# Patient Record
Sex: Female | Born: 1935 | Race: Black or African American | Hispanic: No | Smoking: Never smoker
Health system: Southern US, Community
[De-identification: ages and names within clinical notes are randomized; demographics above are authoritative.]

## PROBLEM LIST (undated history)

## (undated) DIAGNOSIS — I2510 Atherosclerotic heart disease of native coronary artery without angina pectoris: Secondary | ICD-10-CM

## (undated) DIAGNOSIS — J45909 Unspecified asthma, uncomplicated: Secondary | ICD-10-CM

## (undated) DIAGNOSIS — I739 Peripheral vascular disease, unspecified: Secondary | ICD-10-CM

## (undated) DIAGNOSIS — Z940 Kidney transplant status: Secondary | ICD-10-CM

## (undated) DIAGNOSIS — M109 Gout, unspecified: Secondary | ICD-10-CM

## (undated) DIAGNOSIS — I10 Essential (primary) hypertension: Secondary | ICD-10-CM

## (undated) DIAGNOSIS — E785 Hyperlipidemia, unspecified: Secondary | ICD-10-CM

## (undated) DIAGNOSIS — E119 Type 2 diabetes mellitus without complications: Secondary | ICD-10-CM

## (undated) HISTORY — PX: AV FISTULA PLACEMENT: SHX1204

---

## 1975-05-14 HISTORY — PX: ABDOMINAL HYSTERECTOMY: SHX81

## 2003-05-14 HISTORY — PX: KIDNEY TRANSPLANT: SHX239

## 2010-05-25 ENCOUNTER — Other Ambulatory Visit: Payer: Self-pay | Admitting: Nephrology

## 2010-06-13 ENCOUNTER — Other Ambulatory Visit: Payer: Self-pay | Admitting: Nephrology

## 2010-07-12 ENCOUNTER — Other Ambulatory Visit: Payer: Self-pay | Admitting: Nephrology

## 2010-08-22 ENCOUNTER — Other Ambulatory Visit: Payer: Self-pay

## 2010-09-11 ENCOUNTER — Other Ambulatory Visit: Payer: Self-pay

## 2010-10-12 ENCOUNTER — Other Ambulatory Visit: Payer: Self-pay

## 2010-11-30 ENCOUNTER — Other Ambulatory Visit: Payer: Self-pay

## 2010-12-12 ENCOUNTER — Other Ambulatory Visit: Payer: Self-pay

## 2011-01-12 ENCOUNTER — Other Ambulatory Visit: Payer: Self-pay

## 2011-03-07 ENCOUNTER — Other Ambulatory Visit: Payer: Self-pay

## 2011-03-14 ENCOUNTER — Other Ambulatory Visit: Payer: Self-pay

## 2011-04-13 ENCOUNTER — Other Ambulatory Visit: Payer: Self-pay

## 2011-07-08 ENCOUNTER — Other Ambulatory Visit: Payer: Self-pay

## 2011-07-08 LAB — BASIC METABOLIC PANEL
Calcium, Total: 9.7 mg/dL (ref 8.5–10.1)
EGFR (African American): 44 — ABNORMAL LOW
EGFR (Non-African Amer.): 36 — ABNORMAL LOW
Glucose: 145 mg/dL — ABNORMAL HIGH (ref 65–99)
Osmolality: 294 (ref 275–301)

## 2011-07-08 LAB — CBC WITH DIFFERENTIAL/PLATELET
Basophil #: 0 10*3/uL (ref 0.0–0.1)
Basophil %: 0.3 %
HCT: 40 % (ref 35.0–47.0)
HGB: 12.9 g/dL (ref 12.0–16.0)
Lymphocyte %: 15.5 %
Monocyte %: 5 %
Neutrophil #: 5.5 10*3/uL (ref 1.4–6.5)
WBC: 7.1 10*3/uL (ref 3.6–11.0)

## 2011-07-12 ENCOUNTER — Other Ambulatory Visit: Payer: Self-pay

## 2011-07-24 LAB — BASIC METABOLIC PANEL
Anion Gap: 8 (ref 7–16)
BUN: 37 mg/dL — ABNORMAL HIGH (ref 7–18)
Chloride: 106 mmol/L (ref 98–107)
Co2: 25 mmol/L (ref 21–32)
Creatinine: 1.73 mg/dL — ABNORMAL HIGH (ref 0.60–1.30)
EGFR (Non-African Amer.): 31 — ABNORMAL LOW
Osmolality: 287 (ref 275–301)
Sodium: 139 mmol/L (ref 136–145)

## 2011-07-24 LAB — PHOSPHORUS: Phosphorus: 3.6 mg/dL (ref 2.5–4.9)

## 2011-07-24 LAB — CBC WITH DIFFERENTIAL/PLATELET
Basophil #: 0 10*3/uL (ref 0.0–0.1)
Eosinophil #: 0.3 10*3/uL (ref 0.0–0.7)
HCT: 38.9 % (ref 35.0–47.0)
Lymphocyte %: 24.2 %
MCH: 26.5 pg (ref 26.0–34.0)
MCHC: 30.8 g/dL — ABNORMAL LOW (ref 32.0–36.0)
MCV: 86 fL (ref 80–100)
Monocyte %: 7.5 %
Neutrophil #: 4.7 10*3/uL (ref 1.4–6.5)
Neutrophil %: 64.4 %
RDW: 13.2 % (ref 11.5–14.5)

## 2011-07-24 LAB — MAGNESIUM: Magnesium: 2 mg/dL

## 2011-08-12 ENCOUNTER — Other Ambulatory Visit: Payer: Self-pay

## 2011-09-06 LAB — HEMOGLOBIN A1C: Hemoglobin A1C: 8.1 % — ABNORMAL HIGH (ref 4.2–6.3)

## 2011-09-11 ENCOUNTER — Other Ambulatory Visit: Payer: Self-pay

## 2011-09-13 LAB — BASIC METABOLIC PANEL
Calcium, Total: 11.1 mg/dL — ABNORMAL HIGH (ref 8.5–10.1)
Creatinine: 1.78 mg/dL — ABNORMAL HIGH (ref 0.60–1.30)

## 2011-09-13 LAB — MAGNESIUM: Magnesium: 1.9 mg/dL

## 2011-11-01 ENCOUNTER — Other Ambulatory Visit: Payer: Self-pay

## 2011-11-01 LAB — BASIC METABOLIC PANEL WITH GFR
Anion Gap: 7
BUN: 46 mg/dL — ABNORMAL HIGH
Calcium, Total: 9.4 mg/dL
Chloride: 105 mmol/L
Co2: 26 mmol/L
Creatinine: 2.08 mg/dL — ABNORMAL HIGH
EGFR (African American): 27 — ABNORMAL LOW
EGFR (Non-African Amer.): 23 — ABNORMAL LOW
Glucose: 123 mg/dL — ABNORMAL HIGH
Osmolality: 289
Potassium: 4.4 mmol/L
Sodium: 138 mmol/L

## 2011-11-01 LAB — CBC WITH DIFFERENTIAL/PLATELET
Basophil #: 0 x10 3/mm 3
Basophil %: 0.6 %
Eosinophil #: 0.2 x10 3/mm 3
Eosinophil %: 2.6 %
HCT: 38 %
HGB: 12.1 g/dL
Lymphocyte %: 29.3 %
Lymphs Abs: 2.3 x10 3/mm 3
MCH: 27.5 pg
MCHC: 31.8 g/dL — ABNORMAL LOW
MCV: 87 fL
Monocyte #: 0.8 "x10 3/mm "
Monocyte %: 10 %
Neutrophil #: 4.4 x10 3/mm 3
Neutrophil %: 57.5 %
Platelet: 226 x10 3/mm 3
RBC: 4.39 X10 6/mm 3
RDW: 14.6 % — ABNORMAL HIGH
WBC: 7.7 x10 3/mm 3

## 2011-11-01 LAB — PHOSPHORUS: Phosphorus: 3.9 mg/dL (ref 2.5–4.9)

## 2011-11-11 ENCOUNTER — Other Ambulatory Visit: Payer: Self-pay

## 2011-11-13 ENCOUNTER — Encounter (HOSPITAL_COMMUNITY): Payer: Self-pay | Admitting: Radiology

## 2011-11-13 ENCOUNTER — Emergency Department (HOSPITAL_COMMUNITY): Payer: Medicare Other

## 2011-11-13 ENCOUNTER — Inpatient Hospital Stay (HOSPITAL_COMMUNITY)
Admission: EM | Admit: 2011-11-13 | Discharge: 2011-11-15 | DRG: 312 | Disposition: A | Discharge status: Home / Self Care | Payer: Medicare Other | Attending: Internal Medicine | Admitting: Internal Medicine

## 2011-11-13 DIAGNOSIS — Z940 Kidney transplant status: Secondary | ICD-10-CM

## 2011-11-13 DIAGNOSIS — E785 Hyperlipidemia, unspecified: Secondary | ICD-10-CM | POA: Diagnosis present

## 2011-11-13 DIAGNOSIS — I739 Peripheral vascular disease, unspecified: Secondary | ICD-10-CM

## 2011-11-13 DIAGNOSIS — E119 Type 2 diabetes mellitus without complications: Secondary | ICD-10-CM

## 2011-11-13 DIAGNOSIS — R55 Syncope and collapse: Principal | ICD-10-CM | POA: Diagnosis present

## 2011-11-13 DIAGNOSIS — J45909 Unspecified asthma, uncomplicated: Secondary | ICD-10-CM | POA: Diagnosis present

## 2011-11-13 DIAGNOSIS — I2510 Atherosclerotic heart disease of native coronary artery without angina pectoris: Secondary | ICD-10-CM | POA: Diagnosis present

## 2011-11-13 DIAGNOSIS — M109 Gout, unspecified: Secondary | ICD-10-CM

## 2011-11-13 DIAGNOSIS — R29810 Facial weakness: Secondary | ICD-10-CM | POA: Clinically undetermined

## 2011-11-13 DIAGNOSIS — D649 Anemia, unspecified: Secondary | ICD-10-CM | POA: Diagnosis present

## 2011-11-13 DIAGNOSIS — I10 Essential (primary) hypertension: Secondary | ICD-10-CM | POA: Diagnosis present

## 2011-11-13 HISTORY — DX: Peripheral vascular disease, unspecified: I73.9

## 2011-11-13 HISTORY — DX: Kidney transplant status: Z94.0

## 2011-11-13 HISTORY — DX: Unspecified asthma, uncomplicated: J45.909

## 2011-11-13 HISTORY — DX: Type 2 diabetes mellitus without complications: E11.9

## 2011-11-13 HISTORY — DX: Atherosclerotic heart disease of native coronary artery without angina pectoris: I25.10

## 2011-11-13 HISTORY — DX: Hyperlipidemia, unspecified: E78.5

## 2011-11-13 HISTORY — DX: Gout, unspecified: M10.9

## 2011-11-13 HISTORY — DX: Essential (primary) hypertension: I10

## 2011-11-13 LAB — BASIC METABOLIC PANEL
BUN: 32 mg/dL — ABNORMAL HIGH (ref 6–23)
Calcium: 9.5 mg/dL (ref 8.4–10.5)
GFR calc Af Amer: 39 mL/min — ABNORMAL LOW (ref >90)
GFR calc non Af Amer: 34 mL/min — ABNORMAL LOW (ref >90)
Glucose, Bld: 132 mg/dL — ABNORMAL HIGH (ref 70–99)
Potassium: 3.8 mEq/L (ref 3.5–5.1)

## 2011-11-13 LAB — CBC WITH DIFFERENTIAL/PLATELET
Basophils Relative: 0 % (ref 0–1)
Eosinophils Absolute: 0.2 10*3/uL (ref 0.0–0.7)
Eosinophils Relative: 3 % (ref 0–5)
Hemoglobin: 11.7 g/dL — ABNORMAL LOW (ref 12.0–15.0)
Lymphs Abs: 1.5 10*3/uL (ref 0.7–4.0)
MCH: 26.9 pg (ref 26.0–34.0)
MCHC: 31.2 g/dL (ref 30.0–36.0)
MCV: 86.2 fL (ref 78.0–100.0)
Monocytes Relative: 9 % (ref 3–12)
Neutrophils Relative %: 67 % (ref 43–77)
Platelets: 177 10*3/uL (ref 150–400)
RBC: 4.35 MIL/uL (ref 3.87–5.11)

## 2011-11-13 LAB — HEPATIC FUNCTION PANEL
ALT: 9 U/L (ref 0–35)
AST: 12 U/L (ref 0–37)
Albumin: 3.6 g/dL (ref 3.5–5.2)
Bilirubin, Direct: <0.1 mg/dL (ref 0.0–0.3)
Total Bilirubin: 0.3 mg/dL (ref 0.3–1.2)

## 2011-11-13 LAB — CARDIAC PANEL(CRET KIN+CKTOT+MB+TROPI)
Relative Index: INVALID (ref 0.0–2.5)
Total CK: 55 U/L (ref 7–177)
Troponin I: <0.3 ng/mL (ref <0.30)

## 2011-11-13 MED ORDER — ASPIRIN EC 325 MG PO TBEC
325.0000 mg | DELAYED_RELEASE_TABLET | Freq: Every day | ORAL | Status: DC
  Administered 2011-11-14 – 2011-11-15 (×2): 325 mg via ORAL
  Filled 2011-11-13 (×3): qty 1

## 2011-11-13 MED ORDER — CLONIDINE HCL 0.1 MG PO TABS
0.1000 mg | ORAL_TABLET | Freq: Three times a day (TID) | ORAL | Status: DC
  Administered 2011-11-13 – 2011-11-15 (×6): 0.1 mg via ORAL
  Filled 2011-11-13 (×7): qty 1

## 2011-11-13 MED ORDER — IPRATROPIUM BROMIDE 0.02 % IN SOLN: 0.5000 mg | RESPIRATORY_TRACT | Status: DC | PRN

## 2011-11-13 MED ORDER — ALBUTEROL SULFATE (5 MG/ML) 0.5% IN NEBU: 2.5000 mg | INHALATION_SOLUTION | RESPIRATORY_TRACT | Status: DC | PRN

## 2011-11-13 MED ORDER — PRAVASTATIN SODIUM 40 MG PO TABS
40.0000 mg | ORAL_TABLET | Freq: Every day | ORAL | Status: DC
  Administered 2011-11-13 – 2011-11-15 (×3): 40 mg via ORAL
  Filled 2011-11-13 (×3): qty 1

## 2011-11-13 MED ORDER — CALCIUM CARBONATE-VITAMIN D 500-50 MG-UNIT PO CAPS: 1.0000 | ORAL_CAPSULE | Freq: Every day | ORAL | Status: DC

## 2011-11-13 MED ORDER — SODIUM CHLORIDE 0.9 % IV SOLN
INTRAVENOUS | Status: DC
  Administered 2011-11-13 – 2011-11-14 (×2): via INTRAVENOUS

## 2011-11-13 MED ORDER — ACETAMINOPHEN 325 MG PO TABS: 650.0000 mg | ORAL_TABLET | Freq: Four times a day (QID) | ORAL | Status: DC | PRN

## 2011-11-13 MED ORDER — INSULIN ASPART 100 UNIT/ML ~~LOC~~ SOLN
0.0000 [IU] | Freq: Three times a day (TID) | SUBCUTANEOUS | Status: DC
  Administered 2011-11-14 – 2011-11-15 (×3): 1 [IU] via SUBCUTANEOUS

## 2011-11-13 MED ORDER — SPIRONOLACTONE 25 MG PO TABS
25.0000 mg | ORAL_TABLET | Freq: Every day | ORAL | Status: DC
  Administered 2011-11-13 – 2011-11-15 (×3): 25 mg via ORAL
  Filled 2011-11-13 (×3): qty 1

## 2011-11-13 MED ORDER — ENALAPRIL MALEATE 20 MG PO TABS
20.0000 mg | ORAL_TABLET | Freq: Two times a day (BID) | ORAL | Status: DC
  Administered 2011-11-13 – 2011-11-15 (×4): 20 mg via ORAL
  Filled 2011-11-13 (×5): qty 1

## 2011-11-13 MED ORDER — METOPROLOL TARTRATE 100 MG PO TABS
100.0000 mg | ORAL_TABLET | Freq: Two times a day (BID) | ORAL | Status: DC
  Administered 2011-11-13 – 2011-11-15 (×4): 100 mg via ORAL
  Filled 2011-11-13 (×5): qty 1

## 2011-11-13 MED ORDER — SODIUM CHLORIDE 0.9 % IJ SOLN
3.0000 mL | Freq: Two times a day (BID) | INTRAMUSCULAR | Status: DC
  Administered 2011-11-14 – 2011-11-15 (×3): 3 mL via INTRAVENOUS

## 2011-11-13 MED ORDER — CALCIUM CARBONATE-VITAMIN D 500-200 MG-UNIT PO TABS
1.0000 | ORAL_TABLET | Freq: Every day | ORAL | Status: DC
  Administered 2011-11-13 – 2011-11-15 (×3): 1 via ORAL
  Filled 2011-11-13 (×3): qty 1

## 2011-11-13 MED ORDER — CHLORTHALIDONE 25 MG PO TABS
25.0000 mg | ORAL_TABLET | Freq: Every day | ORAL | Status: DC
Start: 2011-11-13 — End: 2011-11-15
  Administered 2011-11-13 – 2011-11-15 (×3): 25 mg via ORAL
  Filled 2011-11-13 (×3): qty 1

## 2011-11-13 MED ORDER — MYCOPHENOLATE SODIUM 180 MG PO TBEC
360.0000 mg | DELAYED_RELEASE_TABLET | Freq: Two times a day (BID) | ORAL | Status: DC
  Administered 2011-11-13 – 2011-11-15 (×4): 360 mg via ORAL
  Filled 2011-11-13 (×5): qty 2

## 2011-11-13 MED ORDER — FELODIPINE ER 10 MG PO TB24
10.0000 mg | ORAL_TABLET | Freq: Two times a day (BID) | ORAL | Status: DC
  Administered 2011-11-13 – 2011-11-15 (×4): 10 mg via ORAL
  Filled 2011-11-13 (×5): qty 1

## 2011-11-13 MED ORDER — PREDNISONE 5 MG PO TABS
5.0000 mg | ORAL_TABLET | Freq: Every day | ORAL | Status: DC
  Administered 2011-11-13 – 2011-11-15 (×3): 5 mg via ORAL
  Filled 2011-11-13 (×3): qty 1

## 2011-11-13 MED ORDER — ONDANSETRON HCL 4 MG PO TABS: 4.0000 mg | ORAL_TABLET | Freq: Four times a day (QID) | ORAL | Status: DC | PRN

## 2011-11-13 MED ORDER — PANTOPRAZOLE SODIUM 40 MG PO TBEC
40.0000 mg | DELAYED_RELEASE_TABLET | Freq: Every day | ORAL | Status: DC
  Administered 2011-11-13 – 2011-11-15 (×3): 40 mg via ORAL
  Filled 2011-11-13 (×3): qty 1

## 2011-11-13 MED ORDER — ONDANSETRON HCL 4 MG/2ML IJ SOLN: 4.0000 mg | Freq: Four times a day (QID) | INTRAMUSCULAR | Status: DC | PRN

## 2011-11-13 MED ORDER — TACROLIMUS 1 MG PO CAPS
2.0000 mg | ORAL_CAPSULE | Freq: Two times a day (BID) | ORAL | Status: DC
  Administered 2011-11-13 – 2011-11-15 (×4): 2 mg via ORAL
  Filled 2011-11-13 (×5): qty 2

## 2011-11-13 MED ORDER — ACETAMINOPHEN 650 MG RE SUPP: 650.0000 mg | Freq: Four times a day (QID) | RECTAL | Status: DC | PRN

## 2011-11-13 MED ORDER — SIMVASTATIN 20 MG PO TABS: 20.0000 mg | ORAL_TABLET | Freq: Every day | ORAL | Status: DC

## 2011-11-13 NOTE — H&P (Signed)
Internal Medicine Teaching Service Resident Admission Note Date: 11/13/2011  Patient name: Wendy Key Medical record number: [id number] Date of birth: 11-19-1935 Age: 76 y.o. Gender: female PCP: Pcp Not In System  Medical Service: Internal Medicine Teaching Service, [facility] Service  I have reviewed the note by Alanson Puls MS IV and was present during the interview and physical exam.  Please see below for findings, assessment, and plan.  Chief Complaint: Syncope  History of Present Illness: Wendy Key is a 76 year old woman with a PMH significant for hypertension, hyperlipidemia, history of ESRD now s/p renal transplant 8 years ago who presented to the [facility] ED following a syncopal episode in church today.  She was at a funeral for her nephew and states she started to feel sick to her stomach.  Her daughter was sitting next to her and noticed that she looked ill and asked her what was wrong when she started to shift to her right and her eyes rolled back and she passed out.  Her daughter states that she did not observe any rhythmic movement of her body or tongue biting but the patient did lose control of her bladder.  Her daughter states she was out for "between 2-5 minutes."  When she came too there was no confusion but she did not remember the event happening or any other preceding symptoms other then feeling hot and the nausea.  She denies palpitations, SOB, numbness, tingling, or weakness.  [name] states that she feels back to her normal currently without treatment in the ED.    Meds: Medications Prior to Admission  Medication Sig Dispense Refill  . acetaminophen (TYLENOL) 500 MG tablet Take 1,000 mg by mouth 2 (two) times daily as needed. For pain      . albuterol (PROVENTIL HFA;VENTOLIN HFA) 108 (90 BASE) MCG/ACT inhaler Inhale 1 puff into the lungs every 6 (six) hours as needed. For shortness of breath      . aspirin EC 325 MG tablet Take 325 mg by mouth daily.      . Calcium  Carbonate-Vitamin D (CALCIUM PLUS VITAMIN D PO) Take 1 tablet by mouth daily.      . chlorthalidone (HYGROTON) 25 MG tablet Take 25 mg by mouth daily.      . cloNIDine (CATAPRES) 0.1 MG tablet Take 0.1 mg by mouth 3 (three) times daily.      . enalapril (VASOTEC) 20 MG tablet Take 20 mg by mouth 2 (two) times daily.      . felodipine (PLENDIL) 10 MG 24 hr tablet Take 10 mg by mouth 2 (two) times daily.      . Fluticasone-Salmeterol (ADVAIR) 250-50 MCG/DOSE AEPB Inhale 1 puff into the lungs every 12 (twelve) hours.      . metoprolol (LOPRESSOR) 100 MG tablet Take 100 mg by mouth 2 (two) times daily.      . mycophenolate (MYFORTIC) 360 MG TBEC Take 360 mg by mouth 2 (two) times daily.      Marland Kitchen omeprazole (PRILOSEC) 20 MG capsule Take 20 mg by mouth daily.      . pravastatin (PRAVACHOL) 40 MG tablet Take 40 mg by mouth daily.      . predniSONE (DELTASONE) 5 MG tablet Take 5 mg by mouth daily.      Marland Kitchen spironolactone (ALDACTONE) 25 MG tablet Take 25 mg by mouth daily.      . tacrolimus (PROGRAF) 1 MG capsule Take 2 mg by mouth 2 (two) times daily.      Marland Kitchen  tiotropium (SPIRIVA) 18 MCG inhalation capsule Place 18 mcg into inhaler and inhale daily.       Allergies: Allergies as of 11/13/2011  . (No Known Allergies)    Past Medical History: Medical Student note reviewed  Family History: Medical Student note reviewed  Social History: Medical Student note reviewed  Surgical History: Medical Student note reviewed  Review of System: Medical Student note reviewed  Physical Exam: Blood pressure 188/82, pulse 60, temperature 98.3 F (36.8 C), temperature source Oral, resp. rate 16, height 5\' 6"  (1.676 m), weight 168 lb 6.9 oz (76.4 kg), SpO2 100.00%. Constitutional: Vital signs reviewed.  Patient is a well-developed and well-nourished woman in no acute distress and cooperative with exam. Alert and oriented x3.  Orthostatic vitals reviewed and are WNL. Head: Normocephalic and atraumatic Ear: TM  normal bilaterally Mouth: no erythema or exudates, MMM Eyes: PERRL, EOMI, conjunctivae normal, No scleral icterus.  Neck: Supple, Trachea midline normal ROM, No JVD, mass, thyromegaly, or carotid bruit present.  Cardiovascular: RRR, S1 normal, S2 normal, no MRG, pulses symmetric and intact bilaterally Pulmonary/Chest: CTAB, no wheezes, rales, or rhonchi Abdominal: Soft. Non-tender, non-distended, bowel sounds are normal, no masses, organomegaly, or guarding present.  GU: no CVA tenderness Musculoskeletal: No joint deformities, erythema, or stiffness, ROM full and no nontender Hematology: no cervical, inginal, or axillary adenopathy.  Neurological: A&O x3, Strength is normal and symmetric bilaterally, cranial nerve II-XII are grossly intact, no focal motor deficit, sensory intact to light touch bilaterally.  Skin: Warm, dry and intact. No rash, cyanosis, or clubbing.  Psychiatric: Normal mood and affect. speech and behavior is normal. Judgment and thought content normal. Cognition and memory are normal.   Labs: Reviewed as noted in the Electronic Record  Imaging: Reviewed as noted in the Electronic Record  Assessment & Plan by Problem: Wendy Key is a 76 year old woman with the PMH listed above who presents following a syncopal episode in church today.   1. Syncopal episode: She states that she has never had this before and that the church was not hot at the time.  Differential Dx includes cardiac, neurology, orthostatic or vasovagal causes of syncope.  There was no seizure-like activity and there was no post-ictal state to indicate seizure.  She has no focal neuro deficits and an unremarkable head CT makes a neuro etiology unlikely.  Orthostatic BP's were normal.  Her initial HR was 55 on arrival and her EKG was only significant for sinus bradycardia secondary to medication effect.  Cardiac etiology including arrythmia or possible symptomatic bradycardia is possible.  Vasovagal response is  also very likely   -Admit to telemetry  -cardiac enzyme trending  -BMET/TSH in AM  -2D Echo in the AM  2.  Hypertension:  She had taken some of her medications in the morning including her blood pressure medications.  Her BP in the ED was164/81-141/68, and we will restart her home medications and continue to monitor BPs.  -clonidine 0.1mg  tid  -enalapril 20 mg bid  -spironolactone 25 mg daily  -chlorthalidone 25 mg daily  - Felodipine 10 mg BID  - Metoprolol 100 mg BID  3.  Elevated creatinine: Serum creatinine was elevated at 1.49.  Pt is 8 years s/p renal transplant, and a review of her records from [facility] reveals that she is at her baseline.  Her baseline Cr ~1.5-1.8.  We will start the patient on IV normal saline to ensure that she is not dehydrated, as well as check urine electrolytes to  calculate a FENa.    - follow-up Cr on AM BMET.  4.  Hyperlipidemia: Will continue patient on home meds and check a FLP in the AM.  -pravastatin 40 mg daily  5.  Immunosuppression: She will need to be restart her immunosuppression for s/p renal transplant.  -mycophenolate 360 mg bid  -prednisone 5 mg daily  -tacrolimus 2 mg po bid  6. CAD: Patient's EKG reveals that her rate is in the 50-60s; we discussed that this might have contributed to her episode.  This appears to be her baseline rate from a review of her medical records.  She has had no changes in her medications. We will continue patient on home meds.  Signed: Hakop Humbarger 11/13/2011, 7:51 PM     Medical Student Hospital Admission Note Date: 11/13/2011  Patient name: Mithra Spano Medical record number: [id number] Date of birth: [birth year] Age: 76 y.o. Gender: female PCP: Pcp Not In System  Medical Service: Internal Medicine B1  Attending physician: Dr. Josem Kaufmann      Chief Complaint: Loss of Consciousness   History of Present Illness: Ms. Scheerer is 76 year old female with an extensive past medical history significant for  hypertension, hyperlipidemia, diabetes mellitus, status-post kidney transplant (2008).  The patient reports that she was attending a church funeral and suddenly began to feel "nauseous and sick to her stomach." At the time she was seated, and had not attempted to stand. She was attending the funeral with her daughter who noticed that she was not feeling well at this time.  After beginning to feel sick, she has no other recollections of the event; all other history is provided by her daughter.  Her daughter states that she started to fall over and she attempted to hold the patient up. The patient then went limp and her dentures started to come out; the daughter believed that she might have been having a seizure or stroke because the pt's eyes were rolling around in her head and she lost consciousness.  She estimates that she was out for ~6 minutes.  She lost control of her bladder, but did not have an vomiting, sweats, dizziness, or aura prior to the event.  Reportedly, there was also no tongue biting or shaking.  Upon regaining consciousness, she was confused as to what happened.  Meds: No current facility-administered medications on file prior to encounter.   Current Outpatient Prescriptions on File Prior to Encounter  Medication Sig Dispense Refill  . albuterol (PROVENTIL HFA;VENTOLIN HFA) 108 (90 BASE) MCG/ACT inhaler Inhale 1 puff into the lungs every 6 (six) hours as needed. For shortness of breath      . Calcium Carbonate-Vitamin D (CALCIUM PLUS VITAMIN D PO) Take 1 tablet by mouth daily.      . chlorthalidone (HYGROTON) 25 MG tablet Take 25 mg by mouth daily.      . cloNIDine (CATAPRES) 0.1 MG tablet Take 0.1 mg by mouth 3 (three) times daily.      . enalapril (VASOTEC) 20 MG tablet Take 20 mg by mouth 2 (two) times daily.      . felodipine (PLENDIL) 10 MG 24 hr tablet Take 10 mg by mouth 2 (two) times daily.      . Fluticasone-Salmeterol (ADVAIR) 250-50 MCG/DOSE AEPB Inhale 1 puff into the lungs  every 12 (twelve) hours.      . metoprolol (LOPRESSOR) 100 MG tablet Take 100 mg by mouth 2 (two) times daily.      Marland Kitchen omeprazole (PRILOSEC) 20 MG capsule  Take 20 mg by mouth daily.      . pravastatin (PRAVACHOL) 40 MG tablet Take 40 mg by mouth daily.      Marland Kitchen spironolactone (ALDACTONE) 25 MG tablet Take 25 mg by mouth daily.      Marland Kitchen tiotropium (SPIRIVA) 18 MCG inhalation capsule Place 18 mcg into inhaler and inhale daily.       Allergies: Allergies as of 11/13/2011  . (No Known Allergies)   Past Medical History  Diagnosis Date  . S/P kidney transplant 11/13/2011    cadevaric transplant  2005 at [facility] Followed by Dr. Cecil Cranker  . Hypertension 11/13/2011  . Hyperlipidemia 11/13/2011  . Diabetes mellitus type II 11/13/2011  . CAD (coronary artery disease) 11/13/2011    50% LAD lesion diagnosed at [facility]  . Asthma 11/13/2011  . Peripheral vascular disease 11/13/2011  . Gout 11/13/2011   Past Surgical History  Procedure Date  . Kidney transplant 2005    At [facility]  . Av fistula placement     x2 with removal of left arm fistula  . Abdominal hysterectomy 1977    for fibroids   Family History  Problem Relation Age of Onset  . Diabetes Father   . Diabetes type II Son    History   Social History  . Marital Status: Widowed    Spouse Name: N/A    Number of Children: N/A  . Years of Education: N/A   Occupational History  . Not on file.   Social History Main Topics  . Smoking status: Never Smoker   . Smokeless tobacco: Never Used  . Alcohol Use: No  . Drug Use: No  . Sexually Active: Not Currently   Other Topics Concern  . Not on file   Social History Narrative   Patient lives in [place] with her Daughter.  PCP is Dr. Ardis Rowan in [place].    Review of Systems: Constitutional: negative Respiratory: negative Cardiovascular: negative for chest pain, chest pressure/discomfort, dyspnea, irregular heart beat and palpitations Neurological: negative for memory  problems, tremors and weakness  Physical Exam: Blood pressure 151/70, pulse 57, temperature 97.6 F (36.4 C), temperature source Oral, resp. rate 14, SpO2 100.00%. General appearance: alert, cooperative and no distress Head: Normocephalic, without obvious abnormality, atraumatic Lungs: clear to auscultation bilaterally Heart: regular rate and rhythm, S1, S2 normal, no murmur, click, rub or gallop Abdomen: soft, non-tender; bowel sounds normal; no masses,  no organomegaly Pulses: 2+ and symmetric  Lab results: Basic Metabolic Panel:  Basename 11/13/11 1506  NA 142  K 3.8  CL 104  CO2 25  GLUCOSE 132*  BUN 32*  CREATININE 1.49*  CALCIUM 9.5  MG --  PHOS --   CBC:  Basename 11/13/11 1506  WBC 7.1  NEUTROABS 4.7  HGB 11.7*  HCT 37.5  MCV 86.2  PLT 177   Cardiac Enzymes:  Basename 11/13/11 1506  CKTOTAL --  CKMB --  CKMBINDEX --  TROPONINI <0.30     Imaging results:  Dg Chest 1 View  11/13/2011  *RADIOLOGY REPORT*  Clinical Data: Chest pain, nausea, syncope, hypertension  CHEST - 1 VIEW  Comparison: Portable exam 1442 hours without priors for comparison.  Findings: Enlargement of cardiac silhouette. Minimal pulmonary vascular congestion. Calcified tortuous aorta. Lungs clear. No pleural effusion or pneumothorax. Lordotic positioning.  IMPRESSION: Enlargement of cardiac silhouette with slight pulmonary vascular congestion, both of which may be accentuated by lordotic positioning. No acute infiltrate.  Original Report Authenticated By:  Lollie Marrow, M.D.   Ct Head Wo Contrast  11/13/2011  *RADIOLOGY REPORT*  Clinical Data: Loss of consciousness.  Possible syncopal episode.  CT HEAD WITHOUT CONTRAST  Technique:  Contiguous axial images were obtained from the base of the skull through the vertex without contrast.  Comparison: None.  Findings: The brain shows generalized atrophy.  There are chronic small vessel changes affecting the cerebral hemispheric white matter.  No  identifiably acute infarction, mass lesion, hemorrhage, hydrocephalus or extra-axial collection.  There are extensive atherosclerotic changes of the major vessels at the base the brain. The calvarium is unremarkable.  Sinuses, middle ears and mastoids are clear.  IMPRESSION: Atrophy and chronic small vessel disease.  No identifiably acute insult.  Original Report Authenticated By: Thomasenia Sales, M.D.    Other results: EKG (performed by EMS):  Normal sinus rhythm, slightly bradycardic (54 bpm).  No axis deviation.    Assessment & Plan by Problem:  1. Syncopal episode: The patient's syncopal episode includes a differential of the following etiologies: cardiac, neurology, orthostatic or vasovagal.  The symptoms do not appear to be seizure-like and there was no post-ictal state; these symptoms along with her unremarkable head CT makes a neuro etiology unlikely.  Orthostatic BP's were normal, and her EKG was also normal (although there was no EKG available for comparison).  This makes a vasovagal response the most likely etiology.  We will continue with a complete syncopal workup.   -telemetry -cardiac enzyme trending -BMET/TSH in AM -2D Echo  2.  Hypertension:  Pt was unable to take all medications today due to the event.  Current BPs range from 164/81-141/68, and we will restart her home medications and continue to monitor BPs. -clonidine 0.1mg  tid -enalapril 20 mg bid -spironolactone 25 mg daily -chlorthalidone 25 mg daily  3.  Elevated creatinine: Serum creatinine was elevated at 1.49.  Pt is 8 years s/p renal transplant, and a review of her records from [facility] reveals that she is at her baseline.  Her baseline Cr ~1.5-1.8.  We will start the patient on IV normal saline to ensure that she is not dehydrated, and follow-up Cr on AM BMET.  4.  Hyperlipidemia: Will continue patient on home meds. -pravastatin 40 mg daily  5.  Immunosuppression: She will need to be restart her immunosuppression for  s/p renal transplant. -mycophenolate 360 mg bid -prednisone 5 mg daily -tacrolimus 2 mg po bid  6. CAD: Patient's EKG reveals that her rate is in the 50-60s; we discussed that this might have contributed to her episode, but this is unlikely as she is on medications for rate control.  This appears to be her baseline rate from a review of her medical records.  She has had no changes in her medications. We will continue patient on home meds. -felodipine 10 mg bid -metoprolol 100 mg bid  This is a Psychologist, occupational Note.  The care of the patient was discussed with Dr. Tonny Branch and the assessment and plan was formulated with their assistance.  Please see their note for official documentation of the patient encounter.   SignedElnita Maxwell 11/13/2011, 7:35 PM

## 2011-11-13 NOTE — ED Notes (Signed)
Went into Patient room to do orthostatic vital signs.  Patient is still at CT

## 2011-11-13 NOTE — ED Provider Notes (Signed)
History     CSN: [id number]  Arrival date & time 11/13/11  1341   First MD Initiated Contact with Patient 11/13/11 1354      Chief Complaint  Patient presents with  . Loss of Consciousness    (Consider location/radiation/quality/duration/timing/severity/associated sxs/prior treatment) Patient is a 76 y.o. female presenting with syncope. The history is provided by the patient and a relative.  Loss of Consciousness  She was sitting down when she stated she was feeling lightheaded and nauseated. Her daughter was with her. She started complaining of feeling hot but did not break out into a sweat. Her eye started to roll back in her head and the daughter told her to keep her from falling. She had loss of consciousness that is estimated at about 2 minutes and took several minutes to come back to normal mental state. She denied chest pain, heaviness, tightness, pressure, dyspnea, diaphoresis. She's never had an episode like this before. She is status post kidney transplant and also has a history of hypertension. She feels like she is back to her baseline now.  History reviewed. No pertinent past medical history.  No past surgical history on file.  No family history on file.  History  Substance Use Topics  . Smoking status: Not on file  . Smokeless tobacco: Not on file  . Alcohol Use: Not on file    OB History    Grav Para Term Preterm Abortions TAB SAB Ect Mult Living                  Review of Systems  Cardiovascular: Positive for syncope.  All other systems reviewed and are negative.    Allergies  Review of patient's allergies indicates no known allergies.  Home Medications  No current outpatient prescriptions on file.  BP 152/74  Pulse 55  Temp 97.6 F (36.4 C) (Oral)  Resp 16  SpO2 100%  Physical Exam  Nursing note and vitals reviewed.  76 year old female who is resting comfortably and in no acute distress. Vital signs are significant for bradycardia with heart  rate 55 and mild hypertension with blood pressure 152/74. Oxygen saturation is 100% which is normal. Head is normocephalic and atraumatic. PERRLA, EOMI. Fundi show no hemorrhage, exudate, or papilledema. There is no facial asymmetry and tongue protrudes in the midline. The neck is nontender and supple without adenopathy or bruit. Back is nontender. Lungs are clear without rales, wheezes, or rhonchi. Heart has regular rate and rhythm without murmur. Abdomen is soft, flat, nontender without masses or hepatosplenomegaly. Extremities have full range of motion, no cyanosis or edema. Skin is warm and dry without rash. Neurologic: Mental status is normal, cranial nerves are intact, there are no motor or sensory deficits.  ED Course  Procedures (including critical care time)  Results for orders placed during the hospital encounter of 11/13/11  CBC WITH DIFFERENTIAL      Component Value Range   WBC 7.1  4.0 - 10.5 K/uL   RBC 4.35  3.87 - 5.11 MIL/uL   Hemoglobin 11.7 (*) 12.0 - 15.0 g/dL   HCT 52.8  41.3 - 24.4 %   MCV 86.2  78.0 - 100.0 fL   MCH 26.9  26.0 - 34.0 pg   MCHC 31.2  30.0 - 36.0 g/dL   RDW 01.0  27.2 - 53.6 %   Platelets 177  150 - 400 K/uL   Neutrophils Relative 67  43 - 77 %   Neutro Abs 4.7  1.7 -  7.7 K/uL   Lymphocytes Relative 22  12 - 46 %   Lymphs Abs 1.5  0.7 - 4.0 K/uL   Monocytes Relative 9  3 - 12 %   Monocytes Absolute 0.6  0.1 - 1.0 K/uL   Eosinophils Relative 3  0 - 5 %   Eosinophils Absolute 0.2  0.0 - 0.7 K/uL   Basophils Relative 0  0 - 1 %   Basophils Absolute 0.0  0.0 - 0.1 K/uL  BASIC METABOLIC PANEL      Component Value Range   Sodium 142  135 - 145 mEq/L   Potassium 3.8  3.5 - 5.1 mEq/L   Chloride 104  96 - 112 mEq/L   CO2 25  19 - 32 mEq/L   Glucose, Bld 132 (*) 70 - 99 mg/dL   BUN 32 (*) 6 - 23 mg/dL   Creatinine, Ser 1.19 (*) 0.50 - 1.10 mg/dL   Calcium 9.5  8.4 - 14.7 mg/dL   GFR calc non Af Amer 34 (*) >90 mL/min   GFR calc Af Amer 39 (*) >90  mL/min  TROPONIN I      Component Value Range   Troponin I <0.30  <0.30 ng/mL   Dg Chest 1 View  11/13/2011  *RADIOLOGY REPORT*  Clinical Data: Chest pain, nausea, syncope, hypertension  CHEST - 1 VIEW  Comparison: Portable exam 1442 hours without priors for comparison.  Findings: Enlargement of cardiac silhouette. Minimal pulmonary vascular congestion. Calcified tortuous aorta. Lungs clear. No pleural effusion or pneumothorax. Lordotic positioning.  IMPRESSION: Enlargement of cardiac silhouette with slight pulmonary vascular congestion, both of which may be accentuated by lordotic positioning. No acute infiltrate.  Original Report Authenticated By: Lollie Marrow, M.D.   Ct Head Wo Contrast  11/13/2011  *RADIOLOGY REPORT*  Clinical Data: Loss of consciousness.  Possible syncopal episode.  CT HEAD WITHOUT CONTRAST  Technique:  Contiguous axial images were obtained from the base of the skull through the vertex without contrast.  Comparison: None.  Findings: The brain shows generalized atrophy.  There are chronic small vessel changes affecting the cerebral hemispheric white matter.  No identifiably acute infarction, mass lesion, hemorrhage, hydrocephalus or extra-axial collection.  There are extensive atherosclerotic changes of the major vessels at the base the brain. The calvarium is unremarkable.  Sinuses, middle ears and mastoids are clear.  IMPRESSION: Atrophy and chronic small vessel disease.  No identifiably acute insult.  Original Report Authenticated By: Thomasenia Sales, M.D.      Date: 11/13/2011  Rate: 54  Rhythm: sinus bradycardia  QRS Axis: normal  Intervals: normal  ST/T Wave abnormalities: nonspecific ST/T changes  Conduction Disutrbances:none  Narrative Interpretation: Sinus bradycardia, left atrial hypertrophy, minor nonspecific ST/T changes. No old ECG available for comparison.  Old EKG Reviewed: none available    1. Syncope   2. S/P kidney transplant       MDM  Syncopal  episode which sounds like it was vasovagal. Bradycardia is probably related to her metoprolol and I doubt if that played a significant role. I suspect that her daughter keeping her from laying down is the reason that she actually passed out. Reason that she would've had a vasovagal episode is not clear. Workup has been initiated for syncope evaluation.   Workup is unremarkable. Creatinine is slightly elevated but an expected range for someone who has a kidney transplant. She will need to be admitted because of syncope without apparent cause. Case is discussed with Dr. Tonny Branch of  outpatient clinics who agrees to admit the patient.     Dione Booze, MD 11/13/11 [phone]

## 2011-11-13 NOTE — ED Notes (Signed)
Pt presents with witnessed syncopal episode approximately 2 minutes, Witnesses states that patient's head rolled back into her head and she would not respond

## 2011-11-14 DIAGNOSIS — R55 Syncope and collapse: Principal | ICD-10-CM

## 2011-11-14 DIAGNOSIS — R29810 Facial weakness: Secondary | ICD-10-CM

## 2011-11-14 DIAGNOSIS — I2510 Atherosclerotic heart disease of native coronary artery without angina pectoris: Secondary | ICD-10-CM

## 2011-11-14 LAB — TSH: TSH: 1.602 u[IU]/mL (ref 0.350–4.500)

## 2011-11-14 LAB — CARDIAC PANEL(CRET KIN+CKTOT+MB+TROPI)
Relative Index: INVALID (ref 0.0–2.5)
Relative Index: INVALID (ref 0.0–2.5)
Total CK: 53 U/L (ref 7–177)
Troponin I: <0.3 ng/mL (ref <0.30)
Troponin I: <0.3 ng/mL (ref <0.30)

## 2011-11-14 LAB — BASIC METABOLIC PANEL
CO2: 23 mEq/L (ref 19–32)
Calcium: 9.6 mg/dL (ref 8.4–10.5)
Chloride: 106 mEq/L (ref 96–112)
Sodium: 142 mEq/L (ref 135–145)

## 2011-11-14 LAB — GLUCOSE, CAPILLARY
Glucose-Capillary: 119 mg/dL — ABNORMAL HIGH (ref 70–99)
Glucose-Capillary: 136 mg/dL — ABNORMAL HIGH (ref 70–99)
Glucose-Capillary: 137 mg/dL — ABNORMAL HIGH (ref 70–99)
Glucose-Capillary: 142 mg/dL — ABNORMAL HIGH (ref 70–99)

## 2011-11-14 LAB — LIPID PANEL
Cholesterol: 164 mg/dL (ref 0–200)
Triglycerides: 103 mg/dL (ref <150)

## 2011-11-14 LAB — HEMOGLOBIN A1C: Mean Plasma Glucose: 171 mg/dL — ABNORMAL HIGH (ref <117)

## 2011-11-14 NOTE — Consult Note (Signed)
[facility] CONSULT [facility]     Reason for Consult: New-onset left facial weakness   HPI:    Wendy Key is an 76 y.o. female Mr. diabetes mellitus, peripheral vascular disease, hypertension and hyperlipidemia who was admitted on 11/13/2011 following an episode of loss of consciousness while at church, likely a syncopal spell. CT scan of her head was unremarkable. Neurology consultation was obtained today because patient appeared to have new-onset left facial weakness. There was also an indication that she had abnormal tongue deviation as well. Patient has not experienced a change in speech. He said no upper or lower extremity weakness as well. She's also experienced no numbness involving her face. She has no history of stroke nor TIA. She's on aspirin 325 mg per day.  Past Medical History  Diagnosis Date  . S/P kidney transplant 11/13/2011    cadevaric transplant  2005 at [facility] Followed by Dr. Cecil Cranker  . Hypertension 11/13/2011  . Hyperlipidemia 11/13/2011  . Diabetes mellitus type II 11/13/2011  . CAD (coronary artery disease) 11/13/2011    50% LAD lesion diagnosed at [facility]  . Asthma 11/13/2011  . Peripheral vascular disease 11/13/2011  . Gout 11/13/2011    Past Surgical History  Procedure Date  . Kidney transplant 2005    At [facility]  . Av fistula placement     x2 with removal of left arm fistula  . Abdominal hysterectomy 1977    for fibroids    Family History  Problem Relation Age of Onset  . Diabetes Father   . Diabetes type II Son     Social History:  reports that she has never smoked. She has never used smokeless tobacco. She reports that she does not drink alcohol or use illicit drugs.  No Known Allergies  Medications:    Scheduled:   . aspirin EC  325 mg Oral Daily  . calcium-vitamin D  1 tablet Oral Daily  . chlorthalidone  25 mg Oral Daily  . cloNIDine  0.1 mg Oral TID  . enalapril  20 mg Oral BID  . felodipine   10 mg Oral BID  . insulin aspart  0-9 Units Subcutaneous TID WC  . metoprolol  100 mg Oral BID  . mycophenolate  360 mg Oral BID  . pantoprazole  40 mg Oral Q1200  . pravastatin  40 mg Oral q1800  . predniSONE  5 mg Oral Daily  . sodium chloride  3 mL Intravenous Q12H  . spironolactone  25 mg Oral Daily  . tacrolimus  2 mg Oral BID  . DISCONTD: Calcium Carbonate-Vitamin D  1 capsule Oral Daily  . DISCONTD: simvastatin  20 mg Oral q1800   ZOX:[id number], acetaminophen, albuterol, ipratropium, ondansetron (ZOFRAN) IV, ondansetron   Blood pressure 138/70, pulse 71, temperature 98 F (36.7 C), temperature source Oral, resp. rate 18, height 5\' 6"  (1.676 m), weight 76.4 kg (168 lb 6.9 oz), SpO2 98.00%.  Neurologic Examination:  Mental Status: Alert, oriented, thought content appropriate.  Speech fluent without evidence of aphasia. Able to follow commands without difficulty. Cranial Nerves: II-Visual fields were normal. III/IV/VI-Pupils were equal and reacted. Extraocular movements were full and conjugate.    V/VII-no facial numbness and no facial weakness, including no upper or lower facial weakness on the left. Lower facial asymmetry at rest was noted. There was no change from picture taken by  [facility] in October 2009 VIII-normal. X-normal speech and symmetrical palatal movement. XII-midline tongue extension with no deviation to either side. Motor: 5/5 bilaterally with normal tone and bulk Sensory: Absent vibratory sensation distally in both lower extremities. Deep Tendon Reflexes: 1+ and symmetric. Plantars: Mute bilaterally Cerebellar: Normal finger-to-nose testing. Carotid auscultation: Normal    Lab Results  Component Value Date/Time   CHOL 164 11/14/2011  3:08 AM    Results for orders placed during the hospital encounter of 11/13/11 (from the past 48 hour(s))  CBC WITH DIFFERENTIAL     Status: Abnormal   Collection Time   11/13/11  3:06 PM      Component Value Range Comment    WBC 7.1  4.0 - 10.5 K/uL    RBC 4.35  3.87 - 5.11 MIL/uL    Hemoglobin 11.7 (*) 12.0 - 15.0 g/dL    HCT 95.6  21.3 - 08.6 %    MCV 86.2  78.0 - 100.0 fL    MCH 26.9  26.0 - 34.0 pg    MCHC 31.2  30.0 - 36.0 g/dL    RDW 57.8  46.9 - 62.9 %    Platelets 177  150 - 400 K/uL    Neutrophils Relative 67  43 - 77 %    Neutro Abs 4.7  1.7 - 7.7 K/uL    Lymphocytes Relative 22  12 - 46 %    Lymphs Abs 1.5  0.7 - 4.0 K/uL    Monocytes Relative 9  3 - 12 %    Monocytes Absolute 0.6  0.1 - 1.0 K/uL    Eosinophils Relative 3  0 - 5 %    Eosinophils Absolute 0.2  0.0 - 0.7 K/uL    Basophils Relative 0  0 - 1 %    Basophils Absolute 0.0  0.0 - 0.1 K/uL   BASIC METABOLIC PANEL     Status: Abnormal   Collection Time   11/13/11  3:06 PM      Component Value Range Comment   Sodium 142  135 - 145 mEq/L    Potassium 3.8  3.5 - 5.1 mEq/L    Chloride 104  96 - 112 mEq/L    CO2 25  19 - 32 mEq/L    Glucose, Bld 132 (*) 70 - 99 mg/dL    BUN 32 (*) 6 - 23 mg/dL    Creatinine, Ser 5.28 (*) 0.50 - 1.10 mg/dL    Calcium 9.5  8.4 - 41.3 mg/dL    GFR calc non Af Amer 34 (*) >90 mL/min    GFR calc Af Amer 39 (*) >90 mL/min   TROPONIN I     Status: Normal   Collection Time   11/13/11  3:06 PM      Component Value Range Comment   Troponin I <0.30  <0.30 ng/mL   CARDIAC PANEL(CRET KIN+CKTOT+MB+TROPI)     Status: Normal   Collection Time   11/13/11  9:14 PM      Component Value Range Comment   Total CK 55  7 - 177 U/L    CK, MB 2.2  0.3 - 4.0 ng/mL    Troponin I <0.30  <0.30 ng/mL    Relative Index RELATIVE INDEX IS INVALID  0.0 - 2.5   HEPATIC FUNCTION PANEL     Status: Normal   Collection Time   11/13/11  9:17 PM      Component Value Range Comment   Total Protein 6.6  6.0 -  8.3 g/dL    Albumin 3.6  3.5 - 5.2 g/dL    AST 12  0 - 37 U/L    ALT 9  0 - 35 U/L    Alkaline Phosphatase 56  39 - 117 U/L    Total Bilirubin 0.3  0.3 - 1.2 mg/dL    Bilirubin, Direct <1.6  0.0 - 0.3 mg/dL    Indirect Bilirubin  NOT CALCULATED  0.3 - 0.9 mg/dL   MAGNESIUM     Status: Normal   Collection Time   11/13/11  9:17 PM      Component Value Range Comment   Magnesium 1.7  1.5 - 2.5 mg/dL   TSH     Status: Normal   Collection Time   11/13/11  9:17 PM      Component Value Range Comment   TSH 1.602  0.350 - 4.500 uIU/mL   HEMOGLOBIN A1C     Status: Abnormal   Collection Time   11/13/11  9:17 PM      Component Value Range Comment   Hemoglobin A1C 7.6 (*) <5.7 %    Mean Plasma Glucose 171 (*) <117 mg/dL   GLUCOSE, CAPILLARY     Status: Abnormal   Collection Time   11/14/11 12:10 AM      Component Value Range Comment   Glucose-Capillary 136 (*) 70 - 99 mg/dL   CARDIAC PANEL(CRET KIN+CKTOT+MB+TROPI)     Status: Normal   Collection Time   11/14/11  3:08 AM      Component Value Range Comment   Total CK 53  7 - 177 U/L    CK, MB 2.3  0.3 - 4.0 ng/mL    Troponin I <0.30  <0.30 ng/mL    Relative Index RELATIVE INDEX IS INVALID  0.0 - 2.5   LIPID PANEL     Status: Abnormal   Collection Time   11/14/11  3:08 AM      Component Value Range Comment   Cholesterol 164  0 - 200 mg/dL    Triglycerides 109  <604 mg/dL    HDL 43  >54 mg/dL    Total CHOL/HDL Ratio 3.8      VLDL 21  0 - 40 mg/dL    LDL Cholesterol 098 (*) 0 - 99 mg/dL   BASIC METABOLIC PANEL     Status: Abnormal   Collection Time   11/14/11  3:08 AM      Component Value Range Comment   Sodium 142  135 - 145 mEq/L    Potassium 4.2  3.5 - 5.1 mEq/L    Chloride 106  96 - 112 mEq/L    CO2 23  19 - 32 mEq/L    Glucose, Bld 142 (*) 70 - 99 mg/dL    BUN 26 (*) 6 - 23 mg/dL    Creatinine, Ser 1.19 (*) 0.50 - 1.10 mg/dL    Calcium 9.6  8.4 - 14.7 mg/dL    GFR calc non Af Amer 39 (*) >90 mL/min    GFR calc Af Amer 45 (*) >90 mL/min   GLUCOSE, CAPILLARY     Status: Abnormal   Collection Time   11/14/11  7:29 AM      Component Value Range Comment   Glucose-Capillary 119 (*) 70 - 99 mg/dL    Comment 1 Notify RN     CARDIAC PANEL(CRET KIN+CKTOT+MB+TROPI)      Status: Normal   Collection Time   11/14/11 10:13 AM  Component Value Range Comment   Total CK 67  7 - 177 U/L    CK, MB 2.2  0.3 - 4.0 ng/mL    Troponin I <0.30  <0.30 ng/mL    Relative Index RELATIVE INDEX IS INVALID  0.0 - 2.5   GLUCOSE, CAPILLARY     Status: Abnormal   Collection Time   11/14/11 11:20 AM      Component Value Range Comment   Glucose-Capillary 142 (*) 70 - 99 mg/dL   GLUCOSE, CAPILLARY     Status: Abnormal   Collection Time   11/14/11  4:33 PM      Component Value Range Comment   Glucose-Capillary 137 (*) 70 - 99 mg/dL     Dg Chest 1 View  05/16/863  *RADIOLOGY REPORT*  Clinical Data: Chest pain, nausea, syncope, hypertension  CHEST - 1 VIEW  Comparison: Portable exam 1442 hours without priors for comparison.  Findings: Enlargement of cardiac silhouette. Minimal pulmonary vascular congestion. Calcified tortuous aorta. Lungs clear. No pleural effusion or pneumothorax. Lordotic positioning.  IMPRESSION: Enlargement of cardiac silhouette with slight pulmonary vascular congestion, both of which may be accentuated by lordotic positioning. No acute infiltrate.  Original Report Authenticated By: Lollie Marrow, M.D.   Ct Head Wo Contrast  11/13/2011  *RADIOLOGY REPORT*  Clinical Data: Loss of consciousness.  Possible syncopal episode.  CT HEAD WITHOUT CONTRAST  Technique:  Contiguous axial images were obtained from the base of the skull through the vertex without contrast.  Comparison: None.  Findings: The brain shows generalized atrophy.  There are chronic small vessel changes affecting the cerebral hemispheric white matter.  No identifiably acute infarction, mass lesion, hemorrhage, hydrocephalus or extra-axial collection.  There are extensive atherosclerotic changes of the major vessels at the base the brain. The calvarium is unremarkable.  Sinuses, middle ears and mastoids are clear.  IMPRESSION: Atrophy and chronic small vessel disease.  No identifiably acute insult.  Original  Report Authenticated By: Thomasenia Sales, M.D.     Assessment/Plan:  1. Normal neurological exam with no evidence of left facial weakness nor tongue weakness. Patient has no clinical signs of acute stroke nor cranial neuropathy. 2. Probable syncopal spell on 11/13/2011. 3. Probable early diabetic peripheral neuropathy.  Recommendations: No further neurodiagnostic studies are indicated. No further neurologic evaluation is warranted.  Venetia Maxon M.D. [facility] [phone]  11/14/2011, 6:44 PM

## 2011-11-14 NOTE — H&P (Signed)
Internal Medicine Teaching Service Attending Note Date: 11/14/2011  Patient name: Wendy Key  Medical record number: [id number]  Date of birth: 04-14-1936   I have seen and evaluated [name] and discussed their care with the Residency Team.  Patient with daughter at bedside. 75 yr. Old AAF w/ hx ESRD s/p cadaveric renal transplant at [facility] in 2005, HTN, NIDDM type 2, HL, CAD w/ 50% LAD lesion, PVD, gout, Asthma, presented with a reported syncopal episode. Daughter states she was unresponsive for about 5 minutes. She also states she felt her right face was "droopy".  But, this got better and appears resolved. She states prior to her syncopal episode she felt "hot" and "nauseated" but denied emesis, CP, SOB, dizziness, palpitations. She was attending a funeral and was at church at the time. Her daughter adds that she had an episode of urinary incontinence at the time as well. She was alert and oriented soon after regaining consciousness.   Furthermore, the patient denies any weakness of her extremities, speech difficulties, confusion. Mrs. Kindig presented to the ED and underwent a CT brain w/o contrast. No acute insult was identified and there was evidence of atrophy and chronic small vessel disease. CXR reviewed and shows cardiomegaly w/ some possible slight pulmonary congestion. Review of telemetry overnight shows sinus bradycardia with a few PVC's.  Physical Exam: Blood pressure 176/80, pulse 75, temperature 98.8 F (37.1 C), temperature source Oral, resp. rate 18, height 5\' 6"  (1.676 m), weight 76.4 kg (168 lb 6.9 oz), SpO2 98.00%. BP 176/80  Pulse 75  Temp 98.8 F (37.1 C) (Oral)  Resp 18  Ht 5\' 6"  (1.676 m)  Wt 76.4 kg (168 lb 6.9 oz)  BMI 27.19 kg/m2  SpO2 98% General appearance: alert, cooperative, appears stated age and no distress Head: atraumatic, left cranial nerve 7 weakness is noted Nose: Nares normal. Septum midline. Mucosa normal. No drainage or sinus  tenderness. Lungs: clear to auscultation bilaterally Heart: S1, S2 normal, no rub and bradycardic Abdomen: soft, non-tender; bowel sounds normal; no masses,  no organomegaly Extremities: extremities normal, atraumatic, no cyanosis or edema Pulses: 2+ and symmetric Skin: Skin color, texture, turgor normal. No rashes or lesions Neurologic: Mental status: Alert, oriented, thought content appropriate Cranial nerves: VII: upper facial muscle function abnormal on the left, VII: lower facial muscle function reduced on the left Motor: weakness on left face Tongue deviation to left Coordination: normal  Lab results: Results for orders placed during the hospital encounter of 11/13/11 (from the past 24 hour(s))  CBC WITH DIFFERENTIAL     Status: Abnormal   Collection Time   11/13/11  3:06 PM      Component Value Range   WBC 7.1  4.0 - 10.5 K/uL   RBC 4.35  3.87 - 5.11 MIL/uL   Hemoglobin 11.7 (*) 12.0 - 15.0 g/dL   HCT 56.2  13.0 - 86.5 %   MCV 86.2  78.0 - 100.0 fL   MCH 26.9  26.0 - 34.0 pg   MCHC 31.2  30.0 - 36.0 g/dL   RDW 78.4  69.6 - 29.5 %   Platelets 177  150 - 400 K/uL   Neutrophils Relative 67  43 - 77 %   Neutro Abs 4.7  1.7 - 7.7 K/uL   Lymphocytes Relative 22  12 - 46 %   Lymphs Abs 1.5  0.7 - 4.0 K/uL   Monocytes Relative 9  3 - 12 %   Monocytes Absolute 0.6  0.1 - 1.0  K/uL   Eosinophils Relative 3  0 - 5 %   Eosinophils Absolute 0.2  0.0 - 0.7 K/uL   Basophils Relative 0  0 - 1 %   Basophils Absolute 0.0  0.0 - 0.1 K/uL  BASIC METABOLIC PANEL     Status: Abnormal   Collection Time   11/13/11  3:06 PM      Component Value Range   Sodium 142  135 - 145 mEq/L   Potassium 3.8  3.5 - 5.1 mEq/L   Chloride 104  96 - 112 mEq/L   CO2 25  19 - 32 mEq/L   Glucose, Bld 132 (*) 70 - 99 mg/dL   BUN 32 (*) 6 - 23 mg/dL   Creatinine, Ser 3.08 (*) 0.50 - 1.10 mg/dL   Calcium 9.5  8.4 - 65.7 mg/dL   GFR calc non Af Amer 34 (*) >90 mL/min   GFR calc Af Amer 39 (*) >90 mL/min    TROPONIN I     Status: Normal   Collection Time   11/13/11  3:06 PM      Component Value Range   Troponin I <0.30  <0.30 ng/mL  CARDIAC PANEL(CRET KIN+CKTOT+MB+TROPI)     Status: Normal   Collection Time   11/13/11  9:14 PM      Component Value Range   Total CK 55  7 - 177 U/L   CK, MB 2.2  0.3 - 4.0 ng/mL   Troponin I <0.30  <0.30 ng/mL   Relative Index RELATIVE INDEX IS INVALID  0.0 - 2.5  HEPATIC FUNCTION PANEL     Status: Normal   Collection Time   11/13/11  9:17 PM      Component Value Range   Total Protein 6.6  6.0 - 8.3 g/dL   Albumin 3.6  3.5 - 5.2 g/dL   AST 12  0 - 37 U/L   ALT 9  0 - 35 U/L   Alkaline Phosphatase 56  39 - 117 U/L   Total Bilirubin 0.3  0.3 - 1.2 mg/dL   Bilirubin, Direct <8.4  0.0 - 0.3 mg/dL   Indirect Bilirubin NOT CALCULATED  0.3 - 0.9 mg/dL  MAGNESIUM     Status: Normal   Collection Time   11/13/11  9:17 PM      Component Value Range   Magnesium 1.7  1.5 - 2.5 mg/dL  TSH     Status: Normal   Collection Time   11/13/11  9:17 PM      Component Value Range   TSH 1.602  0.350 - 4.500 uIU/mL  HEMOGLOBIN A1C     Status: Abnormal   Collection Time   11/13/11  9:17 PM      Component Value Range   Hemoglobin A1C 7.6 (*) <5.7 %   Mean Plasma Glucose 171 (*) <117 mg/dL  GLUCOSE, CAPILLARY     Status: Abnormal   Collection Time   11/14/11 12:10 AM      Component Value Range   Glucose-Capillary 136 (*) 70 - 99 mg/dL  CARDIAC PANEL(CRET KIN+CKTOT+MB+TROPI)     Status: Normal   Collection Time   11/14/11  3:08 AM      Component Value Range   Total CK 53  7 - 177 U/L   CK, MB 2.3  0.3 - 4.0 ng/mL   Troponin I <0.30  <0.30 ng/mL   Relative Index RELATIVE INDEX IS INVALID  0.0 - 2.5  LIPID PANEL     Status:  Abnormal   Collection Time   11/14/11  3:08 AM      Component Value Range   Cholesterol 164  0 - 200 mg/dL   Triglycerides 161  <096 mg/dL   HDL 43  >04 mg/dL   Total CHOL/HDL Ratio 3.8     VLDL 21  0 - 40 mg/dL   LDL Cholesterol 540 (*) 0 - 99 mg/dL   BASIC METABOLIC PANEL     Status: Abnormal   Collection Time   11/14/11  3:08 AM      Component Value Range   Sodium 142  135 - 145 mEq/L   Potassium 4.2  3.5 - 5.1 mEq/L   Chloride 106  96 - 112 mEq/L   CO2 23  19 - 32 mEq/L   Glucose, Bld 142 (*) 70 - 99 mg/dL   BUN 26 (*) 6 - 23 mg/dL   Creatinine, Ser 9.81 (*) 0.50 - 1.10 mg/dL   Calcium 9.6  8.4 - 19.1 mg/dL   GFR calc non Af Amer 39 (*) >90 mL/min   GFR calc Af Amer 45 (*) >90 mL/min  GLUCOSE, CAPILLARY     Status: Abnormal   Collection Time   11/14/11  7:29 AM      Component Value Range   Glucose-Capillary 119 (*) 70 - 99 mg/dL   Comment 1 Notify RN    CARDIAC PANEL(CRET KIN+CKTOT+MB+TROPI)     Status: Normal   Collection Time   11/14/11 10:13 AM      Component Value Range   Total CK 67  7 - 177 U/L   CK, MB 2.2  0.3 - 4.0 ng/mL   Troponin I <0.30  <0.30 ng/mL   Relative Index RELATIVE INDEX IS INVALID  0.0 - 2.5  GLUCOSE, CAPILLARY     Status: Abnormal   Collection Time   11/14/11 11:20 AM      Component Value Range   Glucose-Capillary 142 (*) 70 - 99 mg/dL    Imaging results:  Dg Chest 1 View  11/13/2011  *RADIOLOGY REPORT*  Clinical Data: Chest pain, nausea, syncope, hypertension  CHEST - 1 VIEW  Comparison: Portable exam 1442 hours without priors for comparison.  Findings: Enlargement of cardiac silhouette. Minimal pulmonary vascular congestion. Calcified tortuous aorta. Lungs clear. No pleural effusion or pneumothorax. Lordotic positioning.  IMPRESSION: Enlargement of cardiac silhouette with slight pulmonary vascular congestion, both of which may be accentuated by lordotic positioning. No acute infiltrate.  Original Report Authenticated By: Lollie Marrow, M.D.   Ct Head Wo Contrast  11/13/2011  *RADIOLOGY REPORT*  Clinical Data: Loss of consciousness.  Possible syncopal episode.  CT HEAD WITHOUT CONTRAST  Technique:  Contiguous axial images were obtained from the base of the skull through the vertex without contrast.   Comparison: None.  Findings: The brain shows generalized atrophy.  There are chronic small vessel changes affecting the cerebral hemispheric white matter.  No identifiably acute infarction, mass lesion, hemorrhage, hydrocephalus or extra-axial collection.  There are extensive atherosclerotic changes of the major vessels at the base the brain. The calvarium is unremarkable.  Sinuses, middle ears and mastoids are clear.  IMPRESSION: Atrophy and chronic small vessel disease.  No identifiably acute insult.  Original Report Authenticated By: Thomasenia Sales, M.D.    Assessment and Plan: I agree with the formulated Assessment and Plan with the following changes:  75 yr. Old AAF w/ hx ESRD s/p cadaveric renal transplant at [facility] in 2005, HTN, NIDDM type 2, HL,  CAD w/ 50% LAD lesion, PVD, gout, Asthma, presented with a reported syncopal episode.  1) Syncope: This was likely a vasovagal episode, but given physical exam findings, cannot rule out a recent CVA. Consult neurology for their input. Check MRI brain w/o, MRA brain, Carotid doppler U/S, echo w/ bubble study if recommended by Neuro. Continue ASA for now. She is noted to be chlorthalidone, no reported orthostatic findings in ED. May D/C IVF at this time. 2) HTN: Would not change treatment at this time, await neuro workup. 3) ESRD s/p renal transplant: Appears to be near baseline. No changes. -Discussed with resident physician.  Jonah Blue, DO 7/4/20131:31 PM

## 2011-11-14 NOTE — Progress Notes (Addendum)
Subjective:  Pt feels better. Does not have any chest pain, short of breath, nausea vomiting. She says that she was not drinking enough fluid for past few days and that might be the reason of her passing out. Although she denies any abdominal pain, diarrhea, nausea, vomiting before admission. She lives in [place] and gets all her care in [place]. Objective: Vital signs in last 24 hours: Filed Vitals:   11/13/11 1800 11/13/11 1937 11/14/11 0526 11/14/11 1036  BP: 151/70 188/82 176/78 176/80  Pulse: 57 60 74 75  Temp:  98.3 F (36.8 C) 98.2 F (36.8 C) 98.8 F (37.1 C)  TempSrc:  Oral Oral Oral  Resp: 14 16 18 18   Height:  5\' 6"  (1.676 m)    Weight:  168 lb 6.9 oz (76.4 kg)    SpO2: 100% 100% 96% 98%   Weight change:   Intake/Output Summary (Last 24 hours) at 11/14/11 1215 Last data filed at 11/14/11 0800  Gross per 24 hour  Intake    600 ml  Output      0 ml  Net    600 ml   General: resting in bed, in NAD. HEENT: PERRL, EOMI, no scleral icterus Cardiac: RRR, no rubs, murmurs or gallops Pulm: clear to auscultation bilaterally, moving normal volumes of air Abd: soft, nontender, nondistended, BS present Ext: warm and well perfused, no pedal edema Neuro: alert and oriented X3, L facial droop with L tongue deviation with L facial droop and facial muscle weakness. Rhomberg's sign negative. No other neuro deficits.  Lab Results: Basic Metabolic Panel:  Lab 11/14/11 4098 11/13/11 2117 11/13/11 1506  NA 142 -- 142  K 4.2 -- 3.8  CL 106 -- 104  CO2 23 -- 25  GLUCOSE 142* -- 132*  BUN 26* -- 32*  CREATININE 1.32* -- 1.49*  CALCIUM 9.6 -- 9.5  MG -- 1.7 --  PHOS -- -- --   Liver Function Tests:  Lab 11/13/11 2117  AST 12  ALT 9  ALKPHOS 56  BILITOT 0.3  PROT 6.6  ALBUMIN 3.6  CBC:  Lab 11/13/11 1506  WBC 7.1  NEUTROABS 4.7  HGB 11.7*  HCT 37.5  MCV 86.2  PLT 177   Cardiac Enzymes:  Lab 11/14/11 1013 11/14/11 0308 11/13/11 2114  CKTOTAL 67 53 55    CKMB 2.2 2.3 2.2  CKMBINDEX -- -- --  TROPONINI <0.30 <0.30 <0.30   CBG:  Lab 11/14/11 1120 11/14/11 0729 11/14/11 0010  GLUCAP 142* 119* 136*   Hemoglobin A1C:  Lab 11/13/11 2117  HGBA1C 7.6*   Fasting Lipid Panel:  Lab 11/14/11 0308  CHOL 164  HDL 43  LDLCALC 100*  TRIG 103  CHOLHDL 3.8  LDLDIRECT --   Thyroid Function Tests:  Lab 11/13/11 2117  TSH 1.602  T4TOTAL --  FREET4 --  T3FREE --  THYROIDAB --   Studies/Results: Dg Chest 1 View  11/13/2011  *RADIOLOGY REPORT*  Clinical Data: Chest pain, nausea, syncope, hypertension  CHEST - 1 VIEW  Comparison: Portable exam 1442 hours without priors for comparison.  Findings: Enlargement of cardiac silhouette. Minimal pulmonary vascular congestion. Calcified tortuous aorta. Lungs clear. No pleural effusion or pneumothorax. Lordotic positioning.  IMPRESSION: Enlargement of cardiac silhouette with slight pulmonary vascular congestion, both of which may be accentuated by lordotic positioning. No acute infiltrate.  Original Report Authenticated By: Lollie Marrow, M.D.   Ct Head Wo Contrast  11/13/2011  *RADIOLOGY REPORT*  Clinical Data: Loss of consciousness.  Possible syncopal episode.  CT HEAD WITHOUT CONTRAST  Technique:  Contiguous axial images were obtained from the base of the skull through the vertex without contrast.  Comparison: None.  Findings: The brain shows generalized atrophy.  There are chronic small vessel changes affecting the cerebral hemispheric white matter.  No identifiably acute infarction, mass lesion, hemorrhage, hydrocephalus or extra-axial collection.  There are extensive atherosclerotic changes of the major vessels at the base the brain. The calvarium is unremarkable.  Sinuses, middle ears and mastoids are clear.  IMPRESSION: Atrophy and chronic small vessel disease.  No identifiably acute insult.  Original Report Authenticated By: Thomasenia Sales, M.D.   Medications: I have reviewed the patient's current  medications. Scheduled Meds:   . aspirin EC  325 mg Oral Daily  . calcium-vitamin D  1 tablet Oral Daily  . chlorthalidone  25 mg Oral Daily  . cloNIDine  0.1 mg Oral TID  . enalapril  20 mg Oral BID  . felodipine  10 mg Oral BID  . insulin aspart  0-9 Units Subcutaneous TID WC  . metoprolol  100 mg Oral BID  . mycophenolate  360 mg Oral BID  . pantoprazole  40 mg Oral Q1200  . pravastatin  40 mg Oral q1800  . predniSONE  5 mg Oral Daily  . sodium chloride  3 mL Intravenous Q12H  . spironolactone  25 mg Oral Daily  . tacrolimus  2 mg Oral BID  . DISCONTD: Calcium Carbonate-Vitamin D  1 capsule Oral Daily  . DISCONTD: simvastatin  20 mg Oral q1800   Continuous Infusions:   . sodium chloride 100 mL/hr at 11/14/11 0647   PRN Meds:.acetaminophen, acetaminophen, albuterol, ipratropium, ondansetron (ZOFRAN) IV, ondansetron Assessment/Plan:  1. Syncope: Likely vasovagal versus bradycardia induced vs arrhythmias. Emotional factor likely not contributing as patient says that funereal was not of a close family member. Has no previous significant cardiac or neuro history. Is closely followed by renal and her PCP at [facility]- and her blood pressure medications are managed by renal. Was bradycardic to the lowest 41 in ER. The heart rate being in 60s and 70s after that. No more syncopal events after admission.  - Cardiac Enzymes x3 negative, TSH- 1.6 - 2D Echo with bubble study  - pending. - Will not hold any blood pressure medications at present as patient says that it's been difficult control and this is regimen for her. Although if she becomes bradycardic again, will DC Lopressor.  2) Possible CVA: Pt with L tongue deviation and facial droop. New per daughter. - CT head negative.  - Neuro consult placed. Dr. Roseanne Reno will see pt and then recommend further w/u. - Discussed in detail with Dr. Kem Kays.   3. Hypertension:  blood pressure moderately elevated with no more bradycardia. -  Continue clonidine , enalapril , spironolactone , chlorthalidone, Felodipine , Metoprolol - Will DC beta blocker if needed as discussed above.  4. Elevated creatinine: creatinine trending down. 1.32< 1.49. - Patient with renal transplant. - Will decrease IV fluids to the concern of high blood pressure.  5. Immunosuppression: Continued immunosuppression  -mycophenolate 360 mg bid  -prednisone 5 mg daily  -tacrolimus 2 mg po bid   6.  Disposition: Pending 2-D echo results     LOS: 1 day   Wendy Key 11/14/2011, 12:15 PM

## 2011-11-15 DIAGNOSIS — I10 Essential (primary) hypertension: Secondary | ICD-10-CM

## 2011-11-15 DIAGNOSIS — E119 Type 2 diabetes mellitus without complications: Secondary | ICD-10-CM

## 2011-11-15 DIAGNOSIS — Z940 Kidney transplant status: Secondary | ICD-10-CM

## 2011-11-15 DIAGNOSIS — I059 Rheumatic mitral valve disease, unspecified: Secondary | ICD-10-CM

## 2011-11-15 LAB — BASIC METABOLIC PANEL
BUN: 25 mg/dL — ABNORMAL HIGH (ref 6–23)
CO2: 24 mEq/L (ref 19–32)
Calcium: 9.7 mg/dL (ref 8.4–10.5)
Creatinine, Ser: 1.31 mg/dL — ABNORMAL HIGH (ref 0.50–1.10)

## 2011-11-15 LAB — GLUCOSE, CAPILLARY: Glucose-Capillary: 210 mg/dL — ABNORMAL HIGH (ref 70–99)

## 2011-11-15 MED ORDER — WHITE PETROLATUM GEL
Status: AC
  Administered 2011-11-15: 10:00:00
  Filled 2011-11-15: qty 5

## 2011-11-15 NOTE — Discharge Summary (Signed)
Name:  Wendy, Key MRN: [id number] DOB: 2035-07-10  Date of Admission:  11-13-11 Date of Discharge: 11-15-11 Attending Physician:  Dr. Josem Kaufmann  Discharge Diagnosis: 1. Syncope- most likely vasovagal. Symptomatic Bradycardia cant be ruled out. 2.  HTN 3.  Hx of renal Transplant 4.   CKD  5.  DM@- new Dx- HbA1c- 7.6 6.  HLD 7.  Asthma 8.  Anemia  Discharge Medications: aspirin EC   325 mg  Oral  Daily   calcium-vitamin D   1 tablet  Oral  Daily   chlorthalidone   25 mg  Oral  Daily   cloNIDine   0.1 mg  Oral  TID   enalapril   20 mg  Oral  BID   felodipine   10 mg  Oral  BID   insulin aspart   0-9 Units  Subcutaneous  TID WC   metoprolol   100 mg  Oral  BID   mycophenolate   360 mg  Oral  BID   pantoprazole   40 mg  Oral  Q1200   pravastatin   40 mg  Oral  q1800   predniSONE   5 mg  Oral  Daily   spironolactone   25 mg  Oral  Daily   tacrolimus   2 mg  Oral  BID   albuterol PRN advair- 1 puff bid  Spiriva- daily  Disposition and follow-up:  Wendy Key was discharged from [facility] in stable and improved condition.  She already had a scheduled clinic appointment with her PCP, Dr. Ardis Rowan, in [place].  We advised her to keep that appointment for follow-up of this hospital visit.  At that appointment they should discuss  whether to start therapy or dietary modifications for her elevated Hemoglobin A1C.    Consultations:  Neurology- Dr. Noel Christmas  Procedures Performed:   1.  Chest x-ray 11/13/2011:   Enlargement of cardiac silhouette. Minimal pulmonary vascular congestion. Calcified tortuous aorta. Lungs clear. No pleural effusion or pneumothorax. No acute infiltrates.    2.  Ct Head Wo Contrast 11/13/2011:  The brain shows generalized atrophy.  There are chronic small vessel changes affecting the cerebral hemispheric white matter.  No identifiably acute infarction, mass lesion, hemorrhage, hydrocephalus or extra-axial collection.  There are  extensive atherosclerotic changes of the major vessels at the base the brain. The calvarium is unremarkable.  Sinuses, middle ears and mastoids are clear.    3.  EKG (performed by EMS):  Normal sinus rhythm, slightly bradycardic (54 bpm).  No axis deviation.    4. 2D ECHO:  Study Conclusions  - Left ventricle: The cavity size was normal. Wall thickness was normal. Systolic function was normal. The estimated ejection fraction was in the range of 55% to 60%. Wall motion was normal; there were no regional wall motion abnormalities. Features are consistent with a pseudonormal left ventricular filling pattern, with concomitant abnormal relaxation and increased filling pressure (grade 2 diastolic dysfunction). - Mitral valve: Mild regurgitation. - Pulmonary arteries: PA peak pressure: 54mm Hg (S).   Admission HPI: Wendy Key is a 76 year old woman with a PMH significant for hypertension, hyperlipidemia, history of ESRD now s/p renal transplant 8 years ago who presented to the [facility] ED following a syncopal episode in church today.  She was at a funeral for her nephew and states she started to feel sick to her stomach.  Her daughter was sitting next to her and noticed that she looked ill  and asked her what was wrong when she started to shift to her right and her eyes rolled back and she passed out.  Her daughter states that she did not observe any rhythmic movement of her body or tongue biting but the patient did lose control of her bladder.  Her daughter states she was out for "between 2-5 minutes."  When she came too there was no confusion but she did not remember the event happening or any other preceding symptoms other than feeling hot and the nauseated.  She denies palpitations, SOB, numbness, tingling, or weakness.  Wendy Key states that she feels back to her normal currently without treatment in the ED.    Admission Physical Exam: BP 188/82, P 60, Temp 98.3 F, RR 16, height 5\' 6" , weight 168  lb 6.9 oz, SpO2 100%. Constitutional: Patient is a well-developed and well-nourished woman in no acute distress and cooperative with exam. Alert and oriented x3.  Orthostatic vitals reviewed and are WNL. Head: Normocephalic and atraumatic Ear: TM normal bilaterally Mouth: no erythema or exudates, MMM Eyes: PERRL, EOMI, conjunctivae normal, No scleral icterus.  Neck: Supple, Trachea midline normal ROM, No JVD, mass, thyromegaly, or carotid bruit present.  Cardiovascular: RRR, S1 normal, S2 normal, no MRG, pulses symmetric and intact bilaterally Pulmonary/Chest: CTAB, no wheezes, rales, or rhonchi Abdominal: Soft. Non-tender, non-distended, bowel sounds are normal, no masses, organomegaly, or guarding present.   GU: no CVA tenderness Musculoskeletal: No joint deformities, erythema, or stiffness, ROM full and no nontender Neurological: A&O x3, Strength is normal and symmetric bilaterally, cranial nerve II-XII are grossly intact, no focal motor deficit, sensory intact to light touch bilaterally.  Skin: Warm, dry and intact. No rash, cyanosis, or clubbing.  Psychiatric: Normal mood and affect. speech and behavior is normal. Judgment and thought content normal. Cognition and memory are normal.   Admission Labs: Basic Metabolic Panel: NA  142   K  3.8   CL  104   CO2  25   GLUCOSE  132 BUN  32 CREATININE  1.49 CALCIUM  9.5    CBC: WBC  7.1   HGB  11.7 HCT  37.5   MCV  86.2   PLT  177    Cardiac Enzymes: TROPONINI  <0.30    Hospital Course by problem list: 1. Syncopal episode: The differential diagnosis included cardiac, neurology, orthostatic or vasovagal causes of syncope.  Absence of seizure-like activity and post-ictal state made seizure unlikely.  Head CT was unremarkable; there was a question of left sided facial weakness and left tongue deviation, but neurology consult determined that there were no focal neuro deficits and no further workup was needed.  Orthostatic BP's were  normal.  Cardiac etiology including arrhythmia or possible symptomatic bradycardia is possible, and a 2D ECHO was performed.  The results of the ECHO indicate pt has grade 2 dystolic dysfunction; this may have contributed to her syncopal episode and this will need follow-up as outpatient.  Cardiac enzymes were normal x3.  Vasovagal response is the most likely culprit.         2.  Hypertension:  Pt admitted to possibly skipping some medications due to syncopal episode,  and BP in the ED was 164/81-141/68.  We restarted her home medications and BPs remained slightly elevated, but no changes were made to her regimen.              3.  Elevated creatinine: Serum creatinine was elevated at 1.49.  Pt is 8 years  s/p renal transplant, and a review of her records from [facility] reveals that her baseline Cr ~1.5-1.8.  Pt was started on IV normal saline to ensure she was not dehydrated and Cr on discharge was down to 1.31.    4.  Hyperlipidemia: Fasting lipids were within normal range, and continued pt on pravastatin 40 mg daily.    5.  Immunosuppression: Pt's immunosuppressive therapy (s/p renal transplant) was continued: mycophenolate 360 mg bid, prednisone 5 mg daily, and tacrolimus 2 mg po bid.  6. CAD: Patient's EKG reveals that she has normal rhythm, her rate on admission was in the 50-60s ( lowest- 41); we discussed that this might have contributed to her episode.  A second EKG  on the second day of hospitalization showed an increased rate of 72 bmp.      Total time spent on discharge- 35 minutes.  Sloane Palmer 7:54 PM

## 2011-11-15 NOTE — Progress Notes (Signed)
   CARE MANAGEMENT NOTE 11/15/2011  Patient:  Wendy Key, Wendy Key   Account Number:  [id number]  Date Initiated:  11/15/2011  Documentation initiated by:  Darlyne Russian  Subjective/Objective Assessment:   Patient admitted s/p syncopal episode     Action/Plan:   Progression of care and discharge planning   Anticipated DC Date:  11/15/2011   Anticipated DC Plan:  HOME/SELF CARE      DC Planning Services  CM consult      Choice offered to / List presented to:             Status of service:  In process, will continue to follow Medicare Important Message given?   (If response is "NO", the following Medicare IM given date fields will be blank) Date Medicare IM given:   Date Additional Medicare IM given:    Discharge Disposition:    Per UR Regulation:  Reviewed for med. necessity/level of care/duration of stay  If discussed at Long Length of Stay Meetings, dates discussed:    Comments:  11/15/2011  [address] RN, [place] [phone] Met with patient to discuss CM and discharge planning. She lives with her daughter. No home health services, no issues related to medications or transportation.

## 2011-11-15 NOTE — Progress Notes (Signed)
Internal Medicine Attending  Date: 11/15/2011  Patient name: Brystol Wasilewski Medical record number: [id number] Date of birth: 03/10/1936 Age: 76 y.o. Gender: female  I saw and evaluated the patient. I reviewed the patient's interim history with Dr. Allena Katz and we formulated the assessment and plan together.    Ms. Krogstad feels well and is w/o complaints this morning.  She is looking forward to discharge today and is planning to visit [facility]alyn Gess with her family tomorrow.  I told her based upon our testing this would be OK (via car) but that she should avoid the rides.  Diagnosis is yncope without known cause at this point despite evaluation.  I agree with the housestaff's plan to discharge Ms. Ringel home with follow-up in her Primary Care Physician's office as already scheduled.

## 2011-11-15 NOTE — Progress Notes (Signed)
Subjective:  Ms. Christenberry is a 76 y/o F with PMH of renal transplant, HTN, and CAD admitted for a syncope work-up here on hospital day 2.  The pt is feeling like her normal self, and is ready to go home.  She is concerned about the "new diagnoses" of DM and heart disease, and says that she was not previously told that she has any problems.     Objective: Vital signs in last 24 hours: Filed Vitals:   11/14/11 1746 11/14/11 2115 11/15/11 0532 11/15/11 0900  BP: 138/70 126/54 131/58 155/70  Pulse: 71 60 61 55  Temp: 98 F (36.7 C) 97.8 F (36.6 C) 97.8 F (36.6 C)   TempSrc: Oral  Oral   Resp: 18 18 16    Height:      Weight:      SpO2: 98% 97% 97%    Weight change:   Intake/Output Summary (Last 24 hours) at 11/15/11 1405 Last data filed at 11/15/11 0900  Gross per 24 hour  Intake    480 ml  Output      0 ml  Net    480 ml   General: resting in bed, in NAD. HEENT: PERRL, EOMI, no scleral icterus Cardiac: RRR, no rubs, murmurs or gallops Pulm: clear to auscultation bilaterally, moving normal volumes of air Abd: soft, nontender, nondistended, BS present Ext: warm and well perfused, no pedal edema Neuro: alert and oriented X3, L facial droop with L tongue deviation with L facial droop and facial muscle weakness. Rhomberg's sign negative. No other neuro deficits.  Lab Results: Basic Metabolic Panel:  Lab 11/15/11 1610 11/14/11 0308 11/13/11 2117  NA 142 142 --  K 4.2 4.2 --  CL 107 106 --  CO2 24 23 --  GLUCOSE 120* 142* --  BUN 25* 26* --  CREATININE 1.31* 1.32* --  CALCIUM 9.7 9.6 --  MG -- -- 1.7  PHOS -- -- --   Liver Function Tests:  Lab 11/13/11 2117  AST 12  ALT 9  ALKPHOS 56  BILITOT 0.3  PROT 6.6  ALBUMIN 3.6  CBC:  Lab 11/13/11 1506  WBC 7.1  NEUTROABS 4.7  HGB 11.7*  HCT 37.5  MCV 86.2  PLT 177   Cardiac Enzymes:  Lab 11/14/11 1013 11/14/11 0308 11/13/11 2114  CKTOTAL 67 53 55  CKMB 2.2 2.3 2.2  CKMBINDEX -- -- --  TROPONINI <0.30 <0.30  <0.30   CBG:  Lab 11/15/11 1149 11/15/11 0736 11/14/11 2113 11/14/11 1633 11/14/11 1120 11/14/11 0729  GLUCAP 135* 101* 210* 137* 142* 119*   Hemoglobin A1C:  Lab 11/13/11 2117  HGBA1C 7.6*   Fasting Lipid Panel:  Lab 11/14/11 0308  CHOL 164  HDL 43  LDLCALC 100*  TRIG 103  CHOLHDL 3.8  LDLDIRECT --   Thyroid Function Tests:  Lab 11/13/11 2117  TSH 1.602  T4TOTAL --  FREET4 --  T3FREE --  THYROIDAB --   Studies/Results: Dg Chest 1 View  11/13/2011  *RADIOLOGY REPORT*  Clinical Data: Chest pain, nausea, syncope, hypertension  CHEST - 1 VIEW  Comparison: Portable exam 1442 hours without priors for comparison.  Findings: Enlargement of cardiac silhouette. Minimal pulmonary vascular congestion. Calcified tortuous aorta. Lungs clear. No pleural effusion or pneumothorax. Lordotic positioning.  IMPRESSION: Enlargement of cardiac silhouette with slight pulmonary vascular congestion, both of which may be accentuated by lordotic positioning. No acute infiltrate.  Original Report Authenticated By: Lollie Marrow, M.D.   Ct Head Wo Contrast  11/13/2011  *  RADIOLOGY REPORT*  Clinical Data: Loss of consciousness.  Possible syncopal episode.  CT HEAD WITHOUT CONTRAST  Technique:  Contiguous axial images were obtained from the base of the skull through the vertex without contrast.  Comparison: None.  Findings: The brain shows generalized atrophy.  There are chronic small vessel changes affecting the cerebral hemispheric white matter.  No identifiably acute infarction, mass lesion, hemorrhage, hydrocephalus or extra-axial collection.  There are extensive atherosclerotic changes of the major vessels at the base the brain. The calvarium is unremarkable.  Sinuses, middle ears and mastoids are clear.  IMPRESSION: Atrophy and chronic small vessel disease.  No identifiably acute insult.  Original Report Authenticated By: Thomasenia Sales, M.D.    2D ECHO 11/15/11 Left ventricle: The cavity size was normal.  Wall thickness was normal. Systolic function was normal. The estimated ejection fraction was in the range of 55% to 60%. Wall motion was normal; there were no regional wall motion abnormalities. Features are consistent with a pseudonormal left ventricular filling pattern, with concomitant abnormal relaxation and increased filling pressure (grade 2 diastolic dysfunction). - Mitral valve: Mild regurgitation. - Pulmonary arteries: PA peak pressure: 54mm Hg (S).   Medications: I have reviewed the patient's current medications. Scheduled Meds:    . aspirin EC  325 mg Oral Daily  . calcium-vitamin D  1 tablet Oral Daily  . chlorthalidone  25 mg Oral Daily  . cloNIDine  0.1 mg Oral TID  . enalapril  20 mg Oral BID  . felodipine  10 mg Oral BID  . insulin aspart  0-9 Units Subcutaneous TID WC  . metoprolol  100 mg Oral BID  . mycophenolate  360 mg Oral BID  . pantoprazole  40 mg Oral Q1200  . pravastatin  40 mg Oral q1800  . predniSONE  5 mg Oral Daily  . sodium chloride  3 mL Intravenous Q12H  . spironolactone  25 mg Oral Daily  . tacrolimus  2 mg Oral BID  . white petrolatum       Continuous Infusions:   PRN Meds:.acetaminophen, acetaminophen, albuterol, ipratropium, ondansetron (ZOFRAN) IV, ondansetron Assessment/Plan:  1. Syncope: Likely vasovagal versus bradycardia induced vs arrhythmias. She has had not other syncopal events, and does not have any significant cardiac or neuro history.  Pt's 2D ECHO results indicate that EF 55-60%, mild Mitral valve regurgitation and a grade 2 diastolic dysfunction.  She has follow up established with her PCP.     - Cardiac Enzymes x3 negative, TSH- 1.6 - 2D Echo with bubble study  -  Completed and awaiting results. - Continue home BP medications.    2) Possible CVA: Pt with L tongue deviation and facial droop; was evaluated by Neuro and they determined that no further workup was needed.    3. Hypertension:  blood pressure remains moderately  elevated, but patient has not more bradycardia. - Continue clonidine , enalapril , spironolactone , chlorthalidone, Felodipine , Metoprolol - Will DC beta blocker if needed as discussed above.  4. Elevated creatinine: Pt's creatinine has trended down over the course of hospitalization and is now stable at 1.31<1.32< 1.49.  This is close to her baseline Cr levels.  Yesterday, IV fluids were d/c'd due to concerns of elevated BP. - Patient with renal transplant.  5. Immunosuppression: Continued immunosuppression  -mycophenolate 360 mg bid  -prednisone 5 mg daily  -tacrolimus 2 mg po bid        LOS: 2 days   CAMPBELL, TIMICA D 11/15/2011, 2:05  PM   Resident Co-sign Daily Note: I have seen the patient and reviewed the daily progress note by Alanson Puls MS 4 and discussed the care of the patient with them.  See below for documentation of my findings, assessment, and plans.  Subjective: Pt looks and feels better. Has no N/V, abd pain or fevers overnight. No more syncopal events. No CP, SOB. Objective: Vital signs in last 24 hours: Filed Vitals:   11/15/11 0532 11/15/11 0900 11/15/11 1400 11/15/11 1711  BP: 131/58 155/70 158/70 142/78  Pulse: 61 55 58 62  Temp: 97.8 F (36.6 C)  98 F (36.7 C)   TempSrc: Oral  Oral   Resp: 16  18 18   Height:      Weight:      SpO2: 97%  98%    Physical Exam: General: resting in bed HEENT: PERRL, EOMI, no scleral icterus Cardiac: RRR, no rubs, murmurs or gallops Pulm: clear to auscultation bilaterally, moving normal volumes of air Abd: soft, nontender, nondistended, BS present Ext: warm and well perfused, no pedal edema Neuro: alert and oriented X3, cranial nerves II-XII grossly intact  Lab Results: Reviewed and documented in Electronic Record Micro Results: Reviewed and documented in Electronic Record Studies/Results: Reviewed and documented in Electronic Record Medications: I have reviewed the patient's current  medications. Scheduled Meds:   . aspirin EC  325 mg Oral Daily  . calcium-vitamin D  1 tablet Oral Daily  . chlorthalidone  25 mg Oral Daily  . cloNIDine  0.1 mg Oral TID  . enalapril  20 mg Oral BID  . felodipine  10 mg Oral BID  . insulin aspart  0-9 Units Subcutaneous TID WC  . metoprolol  100 mg Oral BID  . mycophenolate  360 mg Oral BID  . pantoprazole  40 mg Oral Q1200  . pravastatin  40 mg Oral q1800  . predniSONE  5 mg Oral Daily  . sodium chloride  3 mL Intravenous Q12H  . spironolactone  25 mg Oral Daily  . tacrolimus  2 mg Oral BID  . white petrolatum       Continuous Infusions:  PRN Meds:.acetaminophen, acetaminophen, albuterol, ipratropium, ondansetron (ZOFRAN) IV, ondansetron Assessment/Plan: Principal Problem:  *Syncope Active Problems:  Hypertension  S/P kidney transplant  Hyperlipidemia  Diabetes mellitus type II  Asthma  CAD (coronary artery disease)  Normocytic anemia  Facial weakness  Pt will be discharged home with f/u with PCP on Monday. 2D ECHO w/ bubble- EF- 55-60% w/ grade 2 diastolic Dys.  Pt to discuss about her DM with her PCP and decide about oral vs. Insulin regimen. Please review D/C summary for further details.   LOS: 2 days   Quantavia Frith 11/15/2011, 7:43 PM

## 2011-11-15 NOTE — Progress Notes (Signed)
  Echocardiogram 2D Echocardiogram with bubble study has been performed.  Wendy Key 11/15/2011, 11:04 AM

## 2012-02-28 ENCOUNTER — Other Ambulatory Visit: Payer: Self-pay

## 2012-02-28 LAB — BASIC METABOLIC PANEL
BUN: 41 mg/dL — ABNORMAL HIGH (ref 7–18)
EGFR (Non-African Amer.): 28 — ABNORMAL LOW
Glucose: 109 mg/dL — ABNORMAL HIGH (ref 65–99)
Potassium: 4.3 mmol/L (ref 3.5–5.1)
Sodium: 142 mmol/L (ref 136–145)

## 2012-02-28 LAB — CBC WITH DIFFERENTIAL/PLATELET
Basophil #: 0 10*3/uL (ref 0.0–0.1)
Eosinophil #: 0.2 10*3/uL (ref 0.0–0.7)
Eosinophil %: 3.3 %
HCT: 35.3 % (ref 35.0–47.0)
HGB: 11.5 g/dL — ABNORMAL LOW (ref 12.0–16.0)
Lymphocyte #: 1.8 10*3/uL (ref 1.0–3.6)
Lymphocyte %: 27.4 %
MCH: 27.8 pg (ref 26.0–34.0)
MCHC: 32.6 g/dL (ref 32.0–36.0)
Monocyte #: 0.8 x10 3/mm (ref 0.2–0.9)

## 2012-02-28 LAB — PHOSPHORUS: Phosphorus: 3.5 mg/dL (ref 2.5–4.9)

## 2012-02-28 LAB — MAGNESIUM: Magnesium: 1.9 mg/dL

## 2012-05-04 ENCOUNTER — Other Ambulatory Visit: Payer: Self-pay | Admitting: Nephrology

## 2012-05-04 LAB — BASIC METABOLIC PANEL
Calcium, Total: 9.5 mg/dL (ref 8.5–10.1)
Chloride: 107 mmol/L (ref 98–107)
EGFR (Non-African Amer.): 30 — ABNORMAL LOW
Glucose: 109 mg/dL — ABNORMAL HIGH (ref 65–99)
Osmolality: 288 (ref 275–301)
Potassium: 4.2 mmol/L (ref 3.5–5.1)

## 2012-05-04 LAB — CBC WITH DIFFERENTIAL/PLATELET
Eosinophil %: 5 %
Lymphocyte #: 1 10*3/uL (ref 1.0–3.6)
MCH: 27.7 pg (ref 26.0–34.0)
MCHC: 32.1 g/dL (ref 32.0–36.0)
MCV: 86 fL (ref 80–100)
Monocyte #: 1.2 x10 3/mm — ABNORMAL HIGH (ref 0.2–0.9)
Neutrophil %: 66.7 %
Platelet: 213 10*3/uL (ref 150–440)
RBC: 4.38 10*6/uL (ref 3.80–5.20)

## 2012-05-04 LAB — PHOSPHORUS: Phosphorus: 3.6 mg/dL (ref 2.5–4.9)

## 2012-05-04 LAB — MAGNESIUM: Magnesium: 1.8 mg/dL

## 2012-05-13 ENCOUNTER — Other Ambulatory Visit: Payer: Self-pay | Admitting: Nephrology

## 2012-07-31 ENCOUNTER — Other Ambulatory Visit: Payer: Self-pay | Admitting: Nephrology

## 2012-07-31 LAB — CBC WITH DIFFERENTIAL/PLATELET
Eosinophil %: 2.7 %
Lymphocyte #: 1.8 10*3/uL (ref 1.0–3.6)
MCV: 87 fL (ref 80–100)
Neutrophil #: 3.7 10*3/uL (ref 1.4–6.5)
Neutrophil %: 57 %
Platelet: 229 10*3/uL (ref 150–440)

## 2012-07-31 LAB — BASIC METABOLIC PANEL
Calcium, Total: 9.6 mg/dL (ref 8.5–10.1)
Glucose: 97 mg/dL (ref 65–99)
Sodium: 138 mmol/L (ref 136–145)

## 2012-07-31 LAB — PHOSPHORUS: Phosphorus: 3.9 mg/dL (ref 2.5–4.9)

## 2012-08-11 ENCOUNTER — Other Ambulatory Visit: Payer: Self-pay | Admitting: Nephrology

## 2013-04-20 ENCOUNTER — Other Ambulatory Visit: Payer: Self-pay | Admitting: Nephrology

## 2013-04-20 LAB — CBC WITH DIFFERENTIAL/PLATELET
Basophil %: 0.8 %
Eosinophil #: 0.1 10*3/uL (ref 0.0–0.7)
HCT: 37.5 % (ref 35.0–47.0)
Lymphocyte #: 1.3 10*3/uL (ref 1.0–3.6)
Lymphocyte %: 22.9 %
Neutrophil #: 3.8 10*3/uL (ref 1.4–6.5)
Platelet: 185 10*3/uL (ref 150–440)
RBC: 4.39 10*6/uL (ref 3.80–5.20)

## 2013-04-20 LAB — MAGNESIUM: Magnesium: 1.8 mg/dL

## 2013-04-20 LAB — COMPREHENSIVE METABOLIC PANEL
Albumin: 4 g/dL (ref 3.4–5.0)
Alkaline Phosphatase: 67 U/L
Anion Gap: 6 — ABNORMAL LOW (ref 7–16)
BUN: 26 mg/dL — ABNORMAL HIGH (ref 7–18)
Bilirubin,Total: 0.5 mg/dL (ref 0.2–1.0)
Creatinine: 1.41 mg/dL — ABNORMAL HIGH (ref 0.60–1.30)
EGFR (Non-African Amer.): 36 — ABNORMAL LOW
Osmolality: 288 (ref 275–301)
Potassium: 4.8 mmol/L (ref 3.5–5.1)
SGPT (ALT): 15 U/L (ref 12–78)

## 2013-04-20 LAB — PHOSPHORUS: Phosphorus: 3.7 mg/dL (ref 2.5–4.9)

## 2013-04-20 LAB — LIPID PANEL: HDL Cholesterol: 56 mg/dL (ref 40–60)

## 2013-05-13 ENCOUNTER — Other Ambulatory Visit: Payer: Self-pay | Admitting: Nephrology

## 2015-03-17 ENCOUNTER — Inpatient Hospital Stay
Admit: 2015-03-17 | Discharge: 2015-03-17 | Disposition: A | Discharge status: Home / Self Care | Payer: Medicare Other | Attending: Internal Medicine | Admitting: Internal Medicine

## 2015-03-17 ENCOUNTER — Encounter: Payer: Self-pay | Admitting: Emergency Medicine

## 2015-03-17 ENCOUNTER — Emergency Department: Payer: Medicare Other

## 2015-03-17 ENCOUNTER — Other Ambulatory Visit: Payer: Self-pay

## 2015-03-17 ENCOUNTER — Inpatient Hospital Stay
Admission: EM | Admit: 2015-03-17 | Discharge: 2015-03-21 | DRG: 309 | Disposition: A | Discharge status: Home / Self Care | Payer: Medicare Other | Attending: Internal Medicine | Admitting: Internal Medicine

## 2015-03-17 DIAGNOSIS — I248 Other forms of acute ischemic heart disease: Secondary | ICD-10-CM | POA: Diagnosis present

## 2015-03-17 DIAGNOSIS — I2510 Atherosclerotic heart disease of native coronary artery without angina pectoris: Secondary | ICD-10-CM | POA: Diagnosis present

## 2015-03-17 DIAGNOSIS — M109 Gout, unspecified: Secondary | ICD-10-CM | POA: Diagnosis present

## 2015-03-17 DIAGNOSIS — I480 Paroxysmal atrial fibrillation: Secondary | ICD-10-CM | POA: Diagnosis present

## 2015-03-17 DIAGNOSIS — R079 Chest pain, unspecified: Secondary | ICD-10-CM

## 2015-03-17 DIAGNOSIS — E785 Hyperlipidemia, unspecified: Secondary | ICD-10-CM | POA: Diagnosis present

## 2015-03-17 DIAGNOSIS — I129 Hypertensive chronic kidney disease with stage 1 through stage 4 chronic kidney disease, or unspecified chronic kidney disease: Secondary | ICD-10-CM | POA: Diagnosis present

## 2015-03-17 DIAGNOSIS — I4891 Unspecified atrial fibrillation: Secondary | ICD-10-CM | POA: Diagnosis present

## 2015-03-17 DIAGNOSIS — Z7902 Long term (current) use of antithrombotics/antiplatelets: Secondary | ICD-10-CM

## 2015-03-17 DIAGNOSIS — J449 Chronic obstructive pulmonary disease, unspecified: Secondary | ICD-10-CM | POA: Diagnosis present

## 2015-03-17 DIAGNOSIS — J45909 Unspecified asthma, uncomplicated: Secondary | ICD-10-CM | POA: Diagnosis present

## 2015-03-17 DIAGNOSIS — Z833 Family history of diabetes mellitus: Secondary | ICD-10-CM

## 2015-03-17 DIAGNOSIS — Z940 Kidney transplant status: Secondary | ICD-10-CM | POA: Diagnosis not present

## 2015-03-17 DIAGNOSIS — Z7952 Long term (current) use of systemic steroids: Secondary | ICD-10-CM | POA: Diagnosis not present

## 2015-03-17 DIAGNOSIS — N189 Chronic kidney disease, unspecified: Secondary | ICD-10-CM | POA: Diagnosis present

## 2015-03-17 DIAGNOSIS — Z7982 Long term (current) use of aspirin: Secondary | ICD-10-CM | POA: Diagnosis not present

## 2015-03-17 DIAGNOSIS — E1122 Type 2 diabetes mellitus with diabetic chronic kidney disease: Secondary | ICD-10-CM | POA: Diagnosis present

## 2015-03-17 DIAGNOSIS — Z79899 Other long term (current) drug therapy: Secondary | ICD-10-CM

## 2015-03-17 LAB — CBC
HCT: 41.1 % (ref 35.0–47.0)
HEMOGLOBIN: 13.3 g/dL (ref 12.0–16.0)
MCH: 27.9 pg (ref 26.0–34.0)
MCHC: 32.2 g/dL (ref 32.0–36.0)
MCV: 86.6 fL (ref 80.0–100.0)
PLATELETS: 203 10*3/uL (ref 150–440)
RBC: 4.75 MIL/uL (ref 3.80–5.20)
RDW: 14 % (ref 11.5–14.5)
WBC: 8.1 10*3/uL (ref 3.6–11.0)

## 2015-03-17 LAB — TROPONIN I
Troponin I: <0.03 ng/mL (ref <0.031)
Troponin I: 0.16 ng/mL — ABNORMAL HIGH (ref <0.031)
Troponin I: 0.23 ng/mL — ABNORMAL HIGH (ref <0.031)
Troponin I: 0.24 ng/mL — ABNORMAL HIGH (ref <0.031)

## 2015-03-17 LAB — BASIC METABOLIC PANEL
ANION GAP: 8 (ref 5–15)
BUN: 37 mg/dL — ABNORMAL HIGH (ref 6–20)
CHLORIDE: 107 mmol/L (ref 101–111)
CO2: 24 mmol/L (ref 22–32)
CREATININE: 1.52 mg/dL — AB (ref 0.44–1.00)
Calcium: 9.6 mg/dL (ref 8.9–10.3)
GFR calc non Af Amer: 32 mL/min — ABNORMAL LOW (ref >60)
GFR, EST AFRICAN AMERICAN: 37 mL/min — AB (ref >60)
Glucose, Bld: 162 mg/dL — ABNORMAL HIGH (ref 65–99)
Potassium: 4 mmol/L (ref 3.5–5.1)
SODIUM: 139 mmol/L (ref 135–145)

## 2015-03-17 LAB — MRSA PCR SCREENING: MRSA by PCR: NEGATIVE

## 2015-03-17 LAB — GLUCOSE, CAPILLARY: Glucose-Capillary: 110 mg/dL — ABNORMAL HIGH (ref 65–99)

## 2015-03-17 LAB — TSH: TSH: 0.955 u[IU]/mL (ref 0.350–4.500)

## 2015-03-17 MED ORDER — CALCIUM CARBONATE-VITAMIN D 500-50 MG-UNIT PO CAPS: 1.0000 | ORAL_CAPSULE | Freq: Every day | ORAL | Status: DC

## 2015-03-17 MED ORDER — PANTOPRAZOLE SODIUM 40 MG PO TBEC
40.0000 mg | DELAYED_RELEASE_TABLET | Freq: Every day | ORAL | Status: DC
  Administered 2015-03-17: 40 mg via ORAL
  Filled 2015-03-17: qty 1

## 2015-03-17 MED ORDER — MORPHINE SULFATE (PF) 2 MG/ML IV SOLN: 2.0000 mg | INTRAVENOUS | Status: DC | PRN

## 2015-03-17 MED ORDER — DOCUSATE SODIUM 100 MG PO CAPS
100.0000 mg | ORAL_CAPSULE | Freq: Two times a day (BID) | ORAL | Status: DC
  Administered 2015-03-17 – 2015-03-18 (×3): 100 mg via ORAL
  Filled 2015-03-17 (×6): qty 1

## 2015-03-17 MED ORDER — DILTIAZEM HCL 25 MG/5ML IV SOLN
25.0000 mg | Freq: Once | INTRAVENOUS | Status: AC
  Administered 2015-03-17: 25 mg via INTRAVENOUS

## 2015-03-17 MED ORDER — CALCIUM CARBONATE-VITAMIN D 500-200 MG-UNIT PO TABS
1.0000 | ORAL_TABLET | Freq: Every day | ORAL | Status: DC
  Administered 2015-03-17 – 2015-03-21 (×5): 1 via ORAL
  Filled 2015-03-17 (×6): qty 1

## 2015-03-17 MED ORDER — ONDANSETRON HCL 4 MG PO TABS
4.0000 mg | ORAL_TABLET | Freq: Four times a day (QID) | ORAL | Status: DC | PRN
Start: 2015-03-17 — End: 2015-03-21
  Administered 2015-03-20: 4 mg via ORAL
  Filled 2015-03-17: qty 1

## 2015-03-17 MED ORDER — TIOTROPIUM BROMIDE MONOHYDRATE 18 MCG IN CAPS
18.0000 ug | ORAL_CAPSULE | Freq: Every day | RESPIRATORY_TRACT | Status: DC
  Administered 2015-03-17 – 2015-03-21 (×5): 18 ug via RESPIRATORY_TRACT
  Filled 2015-03-17: qty 5

## 2015-03-17 MED ORDER — ALBUTEROL SULFATE (2.5 MG/3ML) 0.083% IN NEBU: 3.0000 mL | INHALATION_SOLUTION | RESPIRATORY_TRACT | Status: DC | PRN

## 2015-03-17 MED ORDER — DILTIAZEM HCL 100 MG IV SOLR
5.0000 mg/h | INTRAVENOUS | Status: DC
  Administered 2015-03-17: 5 mg/h via INTRAVENOUS
  Filled 2015-03-17: qty 100

## 2015-03-17 MED ORDER — SODIUM CHLORIDE 0.9 % IV SOLN
INTRAVENOUS | Status: DC
  Administered 2015-03-17: 08:00:00 via INTRAVENOUS

## 2015-03-17 MED ORDER — ASPIRIN EC 325 MG PO TBEC
325.0000 mg | DELAYED_RELEASE_TABLET | Freq: Every day | ORAL | Status: DC
  Administered 2015-03-17: 325 mg via ORAL
  Filled 2015-03-17: qty 1

## 2015-03-17 MED ORDER — FAMOTIDINE 20 MG PO TABS
20.0000 mg | ORAL_TABLET | Freq: Every day | ORAL | Status: DC
  Administered 2015-03-18 – 2015-03-21 (×4): 20 mg via ORAL
  Filled 2015-03-17 (×4): qty 1

## 2015-03-17 MED ORDER — DILTIAZEM HCL 100 MG IV SOLR
5.0000 mg/h | INTRAVENOUS | Status: DC
  Filled 2015-03-17: qty 100

## 2015-03-17 MED ORDER — CHLORTHALIDONE 25 MG PO TABS
25.0000 mg | ORAL_TABLET | Freq: Every day | ORAL | Status: DC
  Administered 2015-03-17 – 2015-03-21 (×5): 25 mg via ORAL
  Filled 2015-03-17 (×5): qty 1

## 2015-03-17 MED ORDER — ENALAPRIL MALEATE 5 MG PO TABS
20.0000 mg | ORAL_TABLET | Freq: Two times a day (BID) | ORAL | Status: DC
  Administered 2015-03-17 – 2015-03-20 (×8): 20 mg via ORAL
  Filled 2015-03-17: qty 4
  Filled 2015-03-17 (×2): qty 2
  Filled 2015-03-17 (×4): qty 4
  Filled 2015-03-17: qty 2
  Filled 2015-03-17: qty 4

## 2015-03-17 MED ORDER — SPIRONOLACTONE 25 MG PO TABS
25.0000 mg | ORAL_TABLET | Freq: Every day | ORAL | Status: DC
  Administered 2015-03-17 – 2015-03-21 (×5): 25 mg via ORAL
  Filled 2015-03-17 (×5): qty 1

## 2015-03-17 MED ORDER — SODIUM CHLORIDE 0.9 % IJ SOLN
3.0000 mL | Freq: Two times a day (BID) | INTRAMUSCULAR | Status: DC
  Administered 2015-03-17 – 2015-03-21 (×9): 3 mL via INTRAVENOUS

## 2015-03-17 MED ORDER — MOMETASONE FURO-FORMOTEROL FUM 100-5 MCG/ACT IN AERO
2.0000 | INHALATION_SPRAY | Freq: Two times a day (BID) | RESPIRATORY_TRACT | Status: DC
  Administered 2015-03-17 – 2015-03-21 (×9): 2 via RESPIRATORY_TRACT
  Filled 2015-03-17: qty 8.8

## 2015-03-17 MED ORDER — CLONIDINE HCL 0.1 MG PO TABS
0.1000 mg | ORAL_TABLET | Freq: Three times a day (TID) | ORAL | Status: DC
  Administered 2015-03-17 (×3): 0.1 mg via ORAL
  Filled 2015-03-17 (×3): qty 1

## 2015-03-17 MED ORDER — TACROLIMUS 1 MG PO CAPS
2.0000 mg | ORAL_CAPSULE | Freq: Two times a day (BID) | ORAL | Status: DC
  Administered 2015-03-17 – 2015-03-21 (×9): 2 mg via ORAL
  Filled 2015-03-17 (×12): qty 2

## 2015-03-17 MED ORDER — HEPARIN SODIUM (PORCINE) 5000 UNIT/ML IJ SOLN
5000.0000 [IU] | Freq: Three times a day (TID) | INTRAMUSCULAR | Status: DC
  Administered 2015-03-17 – 2015-03-18 (×4): 5000 [IU] via SUBCUTANEOUS
  Filled 2015-03-17 (×4): qty 1

## 2015-03-17 MED ORDER — ONDANSETRON HCL 4 MG/2ML IJ SOLN: 4.0000 mg | Freq: Four times a day (QID) | INTRAMUSCULAR | Status: DC | PRN

## 2015-03-17 MED ORDER — ACETAMINOPHEN 650 MG RE SUPP
650.0000 mg | Freq: Four times a day (QID) | RECTAL | Status: DC | PRN
Start: 2015-03-17 — End: 2015-03-21

## 2015-03-17 MED ORDER — ACETAMINOPHEN 325 MG PO TABS
650.0000 mg | ORAL_TABLET | Freq: Four times a day (QID) | ORAL | Status: DC | PRN
  Administered 2015-03-18 (×2): 650 mg via ORAL
  Filled 2015-03-17 (×2): qty 2

## 2015-03-17 MED ORDER — DILTIAZEM HCL 25 MG/5ML IV SOLN
INTRAVENOUS | Status: AC
  Administered 2015-03-17: 25 mg via INTRAVENOUS
  Filled 2015-03-17: qty 5

## 2015-03-17 MED ORDER — PRAVASTATIN SODIUM 40 MG PO TABS
40.0000 mg | ORAL_TABLET | Freq: Every day | ORAL | Status: DC
  Administered 2015-03-17 – 2015-03-21 (×5): 40 mg via ORAL
  Filled 2015-03-17: qty 2
  Filled 2015-03-17 (×3): qty 1
  Filled 2015-03-17: qty 2

## 2015-03-17 MED ORDER — MYCOPHENOLATE SODIUM 180 MG PO TBEC
360.0000 mg | DELAYED_RELEASE_TABLET | Freq: Two times a day (BID) | ORAL | Status: DC
  Administered 2015-03-17 – 2015-03-21 (×9): 360 mg via ORAL
  Filled 2015-03-17 (×11): qty 2

## 2015-03-17 MED ORDER — PREDNISONE 5 MG PO TABS
5.0000 mg | ORAL_TABLET | Freq: Every day | ORAL | Status: DC
  Administered 2015-03-17 – 2015-03-21 (×5): 5 mg via ORAL
  Filled 2015-03-17 (×5): qty 1

## 2015-03-17 MED ORDER — METOPROLOL TARTRATE 100 MG PO TABS
100.0000 mg | ORAL_TABLET | Freq: Two times a day (BID) | ORAL | Status: DC
  Administered 2015-03-17 (×2): 100 mg via ORAL
  Filled 2015-03-17 (×2): qty 1

## 2015-03-17 MED ORDER — APIXABAN 2.5 MG PO TABS
2.5000 mg | ORAL_TABLET | Freq: Two times a day (BID) | ORAL | Status: DC
  Administered 2015-03-17 – 2015-03-19 (×5): 2.5 mg via ORAL
  Filled 2015-03-17 (×5): qty 1

## 2015-03-17 NOTE — Progress Notes (Signed)
   03/17/15 1030  Clinical Encounter Type  Visited With Patient  Visit Type Initial  Consult/Referral To Chaplain  Spiritual Encounters  Spiritual Needs Emotional  Stress Factors  Patient Stress Factors None identified  Chaplain rounded in the unit and offered a compassionate presence. Asked the patient if there was anything we could do to support her spiritually. She was unable to think of anything at the moment. Chaplain Ramon Zanders A. Milo Schreier Ext. [phone]

## 2015-03-17 NOTE — ED Notes (Signed)
Report from Butch, RN  

## 2015-03-17 NOTE — Progress Notes (Signed)
Patient ID: Wendy Key, female   DOB: [birth year], 79 y.o.   MRN: [id number] [facility] PROGRESS NOTE  PCP: Pcp Not In System  HPI/Subjective: Patient was admitted with atrial fibrillation with rapid ventricular response. She is feeling better now than on presentation. Today about 11 AM she had a 12 second pause.  Objective: Filed Vitals:   03/17/15 1100  BP: 117/71  Pulse: 67  Temp:   Resp: 22    Filed Weights   03/17/15 0312 03/17/15 0800  Weight: 68.04 kg (150 lb) 69.7 kg (153 lb 10.6 oz)    ROS: Review of Systems  Constitutional: Negative for fever and chills.  Eyes: Negative for blurred vision.  Respiratory: Positive for shortness of breath. Negative for cough.   Cardiovascular: Negative for chest pain.  Gastrointestinal: Negative for nausea, vomiting, abdominal pain, diarrhea and constipation.  Genitourinary: Negative for dysuria.  Musculoskeletal: Negative for joint pain.  Neurological: Negative for dizziness and headaches.   Exam: Physical Exam  Constitutional: She is oriented to person, place, and time.  HENT:  Nose: No mucosal edema.  Mouth/Throat: No oropharyngeal exudate or posterior oropharyngeal edema.  Eyes: Conjunctivae, EOM and lids are normal. Pupils are equal, round, and reactive to light.  Neck: No JVD present. Carotid bruit is not present. No edema present. No thyroid mass and no thyromegaly present.  Cardiovascular: S1 normal and S2 normal.  An irregularly irregular rhythm present. Exam reveals no gallop.   Murmur heard.  Systolic murmur is present with a grade of 2/6  Pulses:      Dorsalis pedis pulses are 2+ on the right side, and 2+ on the left side.  Respiratory: No respiratory distress. She has no wheezes. She has no rhonchi. She has no rales.  GI: Soft. Bowel sounds are normal. There is no tenderness.  Musculoskeletal:       Right ankle: She exhibits no swelling.       Left ankle: She exhibits no swelling.   Lymphadenopathy:    She has no cervical adenopathy.  Neurological: She is alert and oriented to person, place, and time. No cranial nerve deficit.  Skin: Skin is warm. No rash noted. Nails show no clubbing.  Psychiatric: She has a normal mood and affect.    Data Reviewed: Basic Metabolic Panel:  Recent Labs Lab 03/17/15 0348  NA 139  K 4.0  CL 107  CO2 24  GLUCOSE 162*  BUN 37*  CREATININE 1.52*  CALCIUM 9.6   CBC:  Recent Labs Lab 03/17/15 0348  WBC 8.1  HGB 13.3  HCT 41.1  MCV 86.6  PLT 203   Cardiac Enzymes:  Recent Labs Lab 03/17/15 0348 03/17/15 0835 03/17/15 1435  TROPONINI <0.03 0.16* 0.23*    CBG:  Recent Labs Lab 03/17/15 0804  GLUCAP 110*    Recent Results (from the past 240 hour(s))  MRSA PCR Screening     Status: None   Collection Time: 03/17/15  8:20 AM  Result Value Ref Range Status   MRSA by PCR NEGATIVE NEGATIVE Final    Comment:        The GeneXpert MRSA Assay (FDA approved for NASAL specimens only), is one component of a comprehensive MRSA colonization surveillance program. It is not intended to diagnose MRSA infection nor to guide or monitor treatment for MRSA infections.      Studies: Dg Chest 2 View  03/17/2015  CLINICAL DATA:  Chest pain, palpitations, and shortness of breath. EXAM: CHEST  2 VIEW  COMPARISON:  11/13/2011 FINDINGS: Central interstitial pattern suggesting bronchitic changes. Normal heart size and pulmonary vascularity. No focal airspace disease or consolidation in the lungs. No blunting of costophrenic angles. No pneumothorax. Mediastinal contours appear intact. Calcified and tortuous aorta. Probable Coronary artery calcifications. Degenerative changes in the spine. IMPRESSION: No evidence of active pulmonary disease. Central interstitial changes suggesting bronchitic change. Electronically Signed   By: Burman NievesWilliam  Stevens M.D.   On: 03/17/2015 03:28    Scheduled Meds: . apixaban  2.5 mg Oral BID  . aspirin  EC  325 mg Oral Daily  . calcium-vitamin D  1 tablet Oral Q breakfast  . chlorthalidone  25 mg Oral Daily  . cloNIDine  0.1 mg Oral TID  . docusate sodium  100 mg Oral BID  . enalapril  20 mg Oral BID  . [START ON 03/18/2015] famotidine  20 mg Oral Daily  . heparin  5,000 Units Subcutaneous 3 times per day  . metoprolol  100 mg Oral BID  . mometasone-formoterol  2 puff Inhalation BID  . mycophenolate  360 mg Oral BID  . pravastatin  40 mg Oral Daily  . predniSONE  5 mg Oral Daily  . sodium chloride  3 mL Intravenous Q12H  . spironolactone  25 mg Oral Daily  . tacrolimus  2 mg Oral BID  . tiotropium  18 mcg Inhalation Daily   Continuous Infusions: . sodium chloride 100 mL/hr at 03/17/15 0818  . diltiazem (CARDIZEM) infusion Stopped (03/17/15 1123)    Assessment/Plan:  1. Atrial fibrillation with rapid ventricular response. Patient initially started on Cardizem drip and admitted to the CCU. Patient's is now rate controlled on metoprolol. Cardizem drip stopped secondary to long pause. Cardiology started eliquis. 2. Long pause of 12 seconds on telemetry monitoring. Continue metoprolol for now. Cardizem drip stopped. Continue to monitor on telemetry and may need to cut back meds further. 3. Elevated troponin demand ischemia from rapid heart rate 4. History of kidney transplant- continue immunosuppressive medications 5. Essential hypertension- continue usual medications 6. Type 2 diabetes mellitus- diet controlled 7. Hyperlipidemia unspecified continue pravastatin  Code Status:     Code Status Orders        Start     Ordered   03/17/15 0818  Full code   Continuous     03/17/15 0817     Family Communication: Family at bedside Disposition Plan: Home soon  Consultants:  Cardiology  Time spent: 35 minutes  Alford HighlandWIETING, Rexanne Inocencio  [facility]

## 2015-03-17 NOTE — ED Notes (Signed)
5 mg of Cardizem given IVP.

## 2015-03-17 NOTE — Progress Notes (Signed)
Dr. Lady GaryFath notified that patient went asystole for 12 seconds. Patient remained alert but stated that she did feel sick to her stomach. She had some bradycardia earlier while resting and cardizem drip was stopped at that time. Will continue to assess and monitor.

## 2015-03-17 NOTE — Treatment Plan (Signed)
Seen on multidisciplinary rounds.  A: pt admitted with afib with RVR, AKI, slight troponin bump.   P Cardiology following.  Consider anticoagulation for Afib.  Will change protonix for H2 blocker.

## 2015-03-17 NOTE — Care Management Note (Signed)
Case Management Note  Patient Details  Name: Wendy Key MRN: [id number] Date of Birth: November 14, 1935  Subjective/Objective:    Case management assessment for discharge planning. Admitted with new onset afib with rvr. Cardizem gtt discontinued  After patient had an 12 second pause. Will most likely be discharged on Eliquis. Met with patient and her children at bedside. She lives at home with her daughter. PCP is at [facility]. Her children help her with transportation. Denies use of DME but would like at [facility] prior to discharge. Patient would benefit from a PT consult.                 Action/Plan:  Following progression.   Expected Discharge Date:                  Expected Discharge Plan:  Home/Self Care  In-House Referral:     Discharge planning Services  CM Consult  Post Acute Care Choice:    Choice offered to:     DME Arranged:    DME Agency:     HH Arranged:    HH Agency:     Status of Service:  In process, will continue to follow  Medicare Important Message Given:    Date Medicare IM Given:    Medicare IM give by:    Date Additional Medicare IM Given:    Additional Medicare Important Message give by:     If discussed at [place] of Stay Meetings, dates discussed:    Additional Comments:  Jolly Mango, RN 03/17/2015, 2:19 PM

## 2015-03-17 NOTE — Progress Notes (Signed)
*  PRELIMINARY RESULTS* Echocardiogram 2D Echocardiogram has been performed.  Wendy Key 03/17/2015, 8:55 PM

## 2015-03-17 NOTE — Consult Note (Signed)
[facility] CARDIOLOGY [facility]  CARDIOLOGY CONSULT NOTE  Patient ID: Wendy Key MRN: [id number] DOB/AGE: 77/21/1937 77 y.o.  Admit date: 03/17/2015 Referring Physician [facility] Primary Physician   Primary Cardiologist   Reason for Consultation afib with rvr  HPI: Pt is a 79 yo female with history of esrd who was on HD for 8-9 years and subsequently has had a renal trpt. She has been doing well with no cardiac problems that she is aware of other than hypertension. She had never had any rhythm problems. She was doing well until this am when she noted her heart was beating irregularly. She also noted chest tightness and pain radiating through to her back . She presented to the er where she was note to be in afib with rvr. Pain and rvr was improved with iv cardizem bolus times two and she was placed on a cardizem drip. Her heart rate improved and she was resting in the icu. She developed nausea and vomiting and was noted to have a 12 second pause. Difficult to determiine tif the pause was due to increased vagal tone with the nausea or if the nausea was secondary to the pause. Cardizem was stopped and she is currently in afib with rate of 70-80. She is hemodynamically stable at present. Mild troponin bump which was likely due to demand ischemia  ROS Review of Systems - History obtained from chart review and the patient General ROS: positive for  - rapid irregular heart rate Respiratory ROS: no cough, shortness of breath, or wheezing Cardiovascular ROS: positive for - chest pain, irregular heartbeat, palpitations and rapid heart rate Gastrointestinal ROS: no abdominal pain, change in bowel habits, or black or bloody stools Musculoskeletal ROS: negative Neurological ROS: no TIA or stroke symptoms   Past Medical History  Diagnosis Date  . S/P kidney transplant 11/13/2011    cadevaric transplant  2005 at [facility] Followed by Dr. Cecil Cranker  . Hypertension 11/13/2011  .  Hyperlipidemia 11/13/2011  . Diabetes mellitus type II 11/13/2011  . CAD (coronary artery disease) 11/13/2011    50% LAD lesion diagnosed at [facility]  . Asthma 11/13/2011  . Peripheral vascular disease (HCC) 11/13/2011  . Gout 11/13/2011    Family History  Problem Relation Age of Onset  . Diabetes Father   . Diabetes type II Son     Social History   Social History  . Marital Status: Widowed    Spouse Name: N/A  . Number of Children: N/A  . Years of Education: N/A   Occupational History  . Not on file.   Social History Main Topics  . Smoking status: Never Smoker   . Smokeless tobacco: Never Used  . Alcohol Use: No  . Drug Use: No  . Sexual Activity: Not Currently   Other Topics Concern  . Not on file   Social History Narrative   Patient lives in [place] with her Daughter.  PCP is Dr. Ardis Rowan in [place].     Past Surgical History  Procedure Laterality Date  . Kidney transplant  2005    At [facility]  . Av fistula placement      x2 with removal of left arm fistula  . Abdominal hysterectomy  1977    for fibroids     Prescriptions prior to admission  Medication Sig Dispense Refill Last Dose  . Calcium Carbonate-Vitamin D (CALCIUM PLUS VITAMIN D PO) Take 1 tablet by mouth daily.   03/16/2015 at  Unknown time  . chlorthalidone (HYGROTON) 25 MG tablet Take 25 mg by mouth daily.   03/16/2015 at Unknown time  . cloNIDine (CATAPRES) 0.1 MG tablet Take 0.1 mg by mouth 3 (three) times daily.   03/16/2015 at Unknown time  . clopidogrel (PLAVIX) 75 MG tablet Take 75 mg by mouth daily.   03/16/2015 at Unknown time  . enalapril (VASOTEC) 20 MG tablet Take 20 mg by mouth 2 (two) times daily.   03/16/2015 at Unknown time  . felodipine (PLENDIL) 10 MG 24 hr tablet Take 10 mg by mouth 2 (two) times daily.   03/16/2015 at Unknown time  . Fluticasone-Salmeterol (ADVAIR) 250-50 MCG/DOSE AEPB Inhale 1 puff into the lungs every 12 (twelve) hours.   03/16/2015 at Unknown time  . metoprolol  (LOPRESSOR) 100 MG tablet Take 100 mg by mouth 2 (two) times daily.   03/16/2015 at pm  . mycophenolate (MYFORTIC) 360 MG TBEC Take 360 mg by mouth 2 (two) times daily.   03/16/2015 at Unknown time  . pravastatin (PRAVACHOL) 40 MG tablet Take 40 mg by mouth daily.   03/16/2015 at Unknown time  . predniSONE (DELTASONE) 5 MG tablet Take 5 mg by mouth daily.   03/16/2015 at Unknown time  . spironolactone (ALDACTONE) 25 MG tablet Take 25 mg by mouth daily.   03/16/2015 at Unknown time  . tacrolimus (PROGRAF) 1 MG capsule Take 2 mg by mouth 2 (two) times daily.   03/16/2015 at Unknown time  . tiotropium (SPIRIVA) 18 MCG inhalation capsule Place 18 mcg into inhaler and inhale daily.   03/16/2015 at Unknown time    Physical Exam: Blood pressure 117/71, pulse 67, temperature 98.2 F (36.8 C), temperature source Oral, resp. rate 22, height 5\' 6"  (1.676 m), weight 69.7 kg (153 lb 10.6 oz), SpO2 99 %.    General appearance: alert and cooperative Resp: clear to auscultation bilaterally Cardio: irregularly irregular rhythm GI: soft, non-tender; bowel sounds normal; no masses,  no organomegaly Extremities: extremities normal, atraumatic, no cyanosis or edema Neurologic: Grossly normal Labs:   Lab Results  Component Value Date   WBC 8.1 03/17/2015   HGB 13.3 03/17/2015   HCT 41.1 03/17/2015   MCV 86.6 03/17/2015   PLT 203 03/17/2015    Recent Labs Lab 03/17/15 0348  NA 139  K 4.0  CL 107  CO2 24  BUN 37*  CREATININE 1.52*  CALCIUM 9.6  GLUCOSE 162*   Lab Results  Component Value Date   CKTOTAL 67 11/14/2011   CKMB 2.2 11/14/2011   TROPONINI 0.16* 03/17/2015      Radiology: No evidence of active pulmonary changes EKG: afib with variable vr. Telemetry revealed afib witih variable vr. Develped slow vr. Converted to nsr for a feew beats prior to 12 second pause. This occurred with nausea and straining at stool   ASSESSMENT AND PLAN:  79 yo female with history of renal trpt who was admitted  with apparent newe onset afib with rvr. Slowed down with iv dilt. Had prolonged puase during bowel movement and nasuea. Dilt drip stopped. Now in afib with controlled vr. Had a few beats of nsr prior to pause suggesting defective atrial escape rhythm. WIll remain off of dilt for now. Pause may have been vagal vs defective atrial recovery.  CHADS 2 Vasc is 4 for age, female gender and hypertension. WIl need consideration for anticoagulation. Would use eliquis 2.5 mg bid. Follow rate. Troponin appears to be secondary to demand.   Signed: Keera Altidor A.  MD, [facility] 03/17/2015, 12:55 PM

## 2015-03-17 NOTE — H&P (Signed)
Wendy Key is an 79 y.o. female.   Chief Complaint: Heart racing HPI: Patient presents emergency department after awakening with palpitations and chest pain. The patient states that she felt very anxious and couldn't tell her heart was beating very fast. Eventually her chest began to hurt. The pain radiated through to her back and down both of her arms. She denies lightheadedness or diaphoresis. Eventually she became short of breath as well. Her chest pain and palpitations did not improve until after her second dose of Cardizem in the emergency department. In total her symptoms lasted a few hours. In the emergency department she was also found to have some decrease in renal function from her baseline. Past medical history significant for kidney transplant. She denies fevers, nausea, vomiting or rash. Telemetry showed atrial fibrillation with rapid ventricular rate which prompted the emergency department staff to call for admission.  Past Medical History  Diagnosis Date  . S/P kidney transplant 11/13/2011    cadevaric transplant  2005 at [place] by Dr. Kandis Nab  . Hypertension 11/13/2011  . Hyperlipidemia 11/13/2011  . Diabetes mellitus type II 11/13/2011  . CAD (coronary artery disease) 11/13/2011    50% LAD lesion diagnosed at [facility]  . Asthma 11/13/2011  . Peripheral vascular disease ([facility]) 11/13/2011  . Gout 11/13/2011    Past Surgical History  Procedure Laterality Date  . Kidney transplant  2005    At [facility]  . Av fistula placement      x2 with removal of left arm fistula  . Abdominal hysterectomy  1977    for fibroids    Family History  Problem Relation Age of Onset  . Diabetes Father   . Diabetes type II Son    Social History:  reports that she has never smoked. She has never used smokeless tobacco. She reports that she does not drink alcohol or use illicit drugs.  Allergies: No Known Allergies  Medications Prior to Admission  Medication Sig Dispense  Refill  . Calcium Carbonate-Vitamin D (CALCIUM PLUS VITAMIN D PO) Take 1 tablet by mouth daily.    . chlorthalidone (HYGROTON) 25 MG tablet Take 25 mg by mouth daily.    . cloNIDine (CATAPRES) 0.1 MG tablet Take 0.1 mg by mouth 3 (three) times daily.    . clopidogrel (PLAVIX) 75 MG tablet Take 75 mg by mouth daily.    . enalapril (VASOTEC) 20 MG tablet Take 20 mg by mouth 2 (two) times daily.    . felodipine (PLENDIL) 10 MG 24 hr tablet Take 10 mg by mouth 2 (two) times daily.    . Fluticasone-Salmeterol (ADVAIR) 250-50 MCG/DOSE AEPB Inhale 1 puff into the lungs every 12 (twelve) hours.    . metoprolol (LOPRESSOR) 100 MG tablet Take 100 mg by mouth 2 (two) times daily.    . mycophenolate (MYFORTIC) 360 MG TBEC Take 360 mg by mouth 2 (two) times daily.    . pravastatin (PRAVACHOL) 40 MG tablet Take 40 mg by mouth daily.    . predniSONE (DELTASONE) 5 MG tablet Take 5 mg by mouth daily.    Marland Kitchen spironolactone (ALDACTONE) 25 MG tablet Take 25 mg by mouth daily.    . tacrolimus (PROGRAF) 1 MG capsule Take 2 mg by mouth 2 (two) times daily.    Marland Kitchen tiotropium (SPIRIVA) 18 MCG inhalation capsule Place 18 mcg into inhaler and inhale daily.      Results for orders placed or performed during the hospital encounter of 03/17/15 (  from the past 48 hour(s))  Basic metabolic panel     Status: Abnormal   Collection Time: 03/17/15  3:48 AM  Result Value Ref Range   Sodium 139 135 - 145 mmol/L   Potassium 4.0 3.5 - 5.1 mmol/L   Chloride 107 101 - 111 mmol/L   CO2 24 22 - 32 mmol/L   Glucose, Bld 162 (H) 65 - 99 mg/dL   BUN 37 (H) 6 - 20 mg/dL   Creatinine, Ser 1.52 (H) 0.44 - 1.00 mg/dL   Calcium 9.6 8.9 - 10.3 mg/dL   GFR calc non Af Amer 32 (L) >60 mL/min   GFR calc Af Amer 37 (L) >60 mL/min    Comment: (NOTE) The eGFR has been calculated using the CKD EPI equation. This calculation has not been validated in all clinical situations. eGFR's persistently <60 mL/min signify possible Chronic  Kidney Disease.    Anion gap 8 5 - 15  CBC     Status: None   Collection Time: 03/17/15  3:48 AM  Result Value Ref Range   WBC 8.1 3.6 - 11.0 K/uL   RBC 4.75 3.80 - 5.20 MIL/uL   Hemoglobin 13.3 12.0 - 16.0 g/dL   HCT 41.1 35.0 - 47.0 %   MCV 86.6 80.0 - 100.0 fL   MCH 27.9 26.0 - 34.0 pg   MCHC 32.2 32.0 - 36.0 g/dL   RDW 14.0 11.5 - 14.5 %   Platelets 203 150 - 440 K/uL  Troponin I     Status: None   Collection Time: 03/17/15  3:48 AM  Result Value Ref Range   Troponin I <0.03 <0.031 ng/mL    Comment:        NO INDICATION OF MYOCARDIAL INJURY.   TSH     Status: None   Collection Time: 03/17/15  3:48 AM  Result Value Ref Range   TSH 0.955 0.350 - 4.500 uIU/mL   Dg Chest 2 View  03/17/2015  CLINICAL DATA:  Chest pain, palpitations, and shortness of breath. EXAM: CHEST  2 VIEW COMPARISON:  11/13/2011 FINDINGS: Central interstitial pattern suggesting bronchitic changes. Normal heart size and pulmonary vascularity. No focal airspace disease or consolidation in the lungs. No blunting of costophrenic angles. No pneumothorax. Mediastinal contours appear intact. Calcified and tortuous aorta. Probable Coronary artery calcifications. Degenerative changes in the spine. IMPRESSION: No evidence of active pulmonary disease. Central interstitial changes suggesting bronchitic change. Electronically Signed   By: Lucienne Capers M.D.   On: 03/17/2015 03:28    Review of Systems  Constitutional: Negative for fever and chills.  HENT: Negative for sore throat and tinnitus.   Eyes: Negative for blurred vision and redness.  Respiratory: Positive for shortness of breath. Negative for cough.   Cardiovascular: Positive for chest pain and palpitations. Negative for orthopnea and PND.  Gastrointestinal: Negative for nausea, vomiting, abdominal pain and diarrhea.  Genitourinary: Negative for dysuria, urgency and frequency.  Musculoskeletal: Negative for myalgias and joint pain.  Skin: Negative for  rash.       No lesions  Neurological: Negative for speech change, focal weakness and weakness.  Endo/Heme/Allergies: Does not bruise/bleed easily.       No temperature intolerance  Psychiatric/Behavioral: Negative for depression and suicidal ideas.    Blood pressure 135/83, pulse 108, resp. rate 20, height 5' 6"  (1.676 m), weight 68.04 kg (150 lb), SpO2 95 %. Physical Exam  Vitals reviewed. Constitutional: She is oriented to person, place, and time. She appears well-developed and well-nourished.  No distress.  HENT:  Head: Normocephalic and atraumatic.  Mouth/Throat: Oropharynx is clear and moist.  Eyes: Conjunctivae and EOM are normal. Pupils are equal, round, and reactive to light. No scleral icterus.  Neck: Normal range of motion. No JVD present. No tracheal deviation present. No thyromegaly present.  Cardiovascular: Normal rate and normal heart sounds.  An irregularly irregular rhythm present. Exam reveals no gallop and no friction rub.   No murmur heard. Respiratory: Effort normal and breath sounds normal.  GI: Soft. Bowel sounds are normal. She exhibits no distension. There is no tenderness.  Genitourinary:  Deferred  Musculoskeletal: Normal range of motion. She exhibits no edema.  Lymphadenopathy:    She has no cervical adenopathy.  Neurological: She is alert and oriented to person, place, and time. No cranial nerve deficit. She exhibits normal muscle tone.  Skin: Skin is warm and dry. No rash noted. No erythema.  Psychiatric: She has a normal mood and affect. Her behavior is normal. Judgment and thought content normal.     Assessment/Plan This is a 79 year old African female admitted for atrial fibrillation with rapid ventricular rate. 1. A. fib with RVR: The patient on a cardizem drip. I have held her sustained-release felodipine. We'll likely restart once rate controlled. 2. CAD: Chest pain is resolved. However, there are some flattening of ST segments with patient's EKG.  We will follow cardiac biomarkers. Continue Plavix. Cardiology consulted 3. Hypertension: Continue chlorthalidone, clonidine, metoprolol, enalapril and spironolactone 4. Kidney transplant: Continue immunosuppressive therapy 5. COPD/asthma: Continue Spiriva and Advair 6. Metabolic syndrome: Check hemoglobin A1c. Sliding scale insulin if needed 7. DVT prophylaxis: Heparin 8. GI prophylaxis: None The patient is a full code. Time spent on admission orders and critical care approximately 45 minutes  Harrie Foreman 03/17/2015, 8:03 AM

## 2015-03-17 NOTE — ED Notes (Signed)
Pt presents to ED with c/o CP, arm pain, and back pain that started around 1am this morning. Pt reports the pain woke her from sleep; denies any c/o N/V or SHOB. Pt reports being an ex-dialysis pt, but received a kidney trnsplant approximately 12 yrs ago. Pt denies any known cardiac issues or cardiac-related medical history. Pt is A&O, in NAD, respirations even, regular, and unlabored, with family at bedside.

## 2015-03-17 NOTE — ED Notes (Signed)
10 mg of Cardizem given IVP.

## 2015-03-17 NOTE — ED Provider Notes (Signed)
[facility] Emergency Department Provider Note  ____________________________________________  Time seen: 3:15 AM  I have reviewed the triage vital signs and the nursing notes.   HISTORY  Chief Complaint Chest Pain and Palpitations      HPI Wendy Key is a 79 y.o. female presents with acute onset of left-sided chest pain at 1 AM this morning patient states that the pain awoke her from sleep. Patient denies any nausea vomiting no dyspnea. Patient has no history of cardiac disease. Patient does have a history of renal failure that is supposed kidney transplant 12 years ago.     Past Medical History  Diagnosis Date  . S/P kidney transplant 11/13/2011    cadevaric transplant  2005 at [facility] Followed by Dr. Cecil Cranker  . Hypertension 11/13/2011  . Hyperlipidemia 11/13/2011  . Diabetes mellitus type II 11/13/2011  . CAD (coronary artery disease) 11/13/2011    50% LAD lesion diagnosed at [facility]  . Asthma 11/13/2011  . Peripheral vascular disease (HCC) 11/13/2011  . Gout 11/13/2011    Patient Active Problem List   Diagnosis Date Noted  . Facial weakness 11/14/2011  . Hypertension 11/13/2011  . S/P kidney transplant 11/13/2011  . Hyperlipidemia 11/13/2011  . Diabetes mellitus type II 11/13/2011  . Asthma 11/13/2011  . Gout 11/13/2011  . CAD (coronary artery disease) 11/13/2011  . Peripheral vascular disease (HCC) 11/13/2011  . Syncope 11/13/2011  . Normocytic anemia 11/13/2011    Past Surgical History  Procedure Laterality Date  . Kidney transplant  2005    At [facility]  . Av fistula placement      x2 with removal of left arm fistula  . Abdominal hysterectomy  1977    for fibroids    Current Outpatient Rx  Name  Route  Sig  Dispense  Refill  . acetaminophen (TYLENOL) 500 MG tablet   Oral   Take 1,000 mg by mouth 2 (two) times daily as needed. For pain         . albuterol (PROVENTIL HFA;VENTOLIN HFA) 108 (90 BASE) MCG/ACT  inhaler   Inhalation   Inhale 1 puff into the lungs every 6 (six) hours as needed. For shortness of breath         . aspirin EC 325 MG tablet   Oral   Take 325 mg by mouth daily.         . Calcium Carbonate-Vitamin D (CALCIUM PLUS VITAMIN D PO)   Oral   Take 1 tablet by mouth daily.         . chlorthalidone (HYGROTON) 25 MG tablet   Oral   Take 25 mg by mouth daily.         . cloNIDine (CATAPRES) 0.1 MG tablet   Oral   Take 0.1 mg by mouth 3 (three) times daily.         . enalapril (VASOTEC) 20 MG tablet   Oral   Take 20 mg by mouth 2 (two) times daily.         . felodipine (PLENDIL) 10 MG 24 hr tablet   Oral   Take 10 mg by mouth 2 (two) times daily.         . Fluticasone-Salmeterol (ADVAIR) 250-50 MCG/DOSE AEPB   Inhalation   Inhale 1 puff into the lungs every 12 (twelve) hours.         . metoprolol (LOPRESSOR) 100 MG tablet   Oral   Take 100 mg by mouth 2 (two)  times daily.         . mycophenolate (MYFORTIC) 360 MG TBEC   Oral   Take 360 mg by mouth 2 (two) times daily.         Marland Kitchen. omeprazole (PRILOSEC) 20 MG capsule   Oral   Take 20 mg by mouth daily.         . pravastatin (PRAVACHOL) 40 MG tablet   Oral   Take 40 mg by mouth daily.         . predniSONE (DELTASONE) 5 MG tablet   Oral   Take 5 mg by mouth daily.         Marland Kitchen. spironolactone (ALDACTONE) 25 MG tablet   Oral   Take 25 mg by mouth daily.         . tacrolimus (PROGRAF) 1 MG capsule   Oral   Take 2 mg by mouth 2 (two) times daily.         Marland Kitchen. tiotropium (SPIRIVA) 18 MCG inhalation capsule   Inhalation   Place 18 mcg into inhaler and inhale daily.           Allergies No known drug allergies  Family History  Problem Relation Age of Onset  . Diabetes Father   . Diabetes type II Son     Social History Social History  Substance Use Topics  . Smoking status: Never Smoker   . Smokeless tobacco: Never Used  . Alcohol Use: No    Review of  Systems  Constitutional: Negative for fever. Eyes: Negative for visual changes. ENT: Negative for sore throat. Cardiovascular: Positive for chest pain. Respiratory: Negative for shortness of breath. Gastrointestinal: Negative for abdominal pain, vomiting and diarrhea. Genitourinary: Negative for dysuria. Musculoskeletal: Negative for back pain. Skin: Negative for rash. Neurological: Negative for headaches, focal weakness or numbness.   10-point ROS otherwise negative.  ____________________________________________   PHYSICAL EXAM:  VITAL SIGNS: ED Triage Vitals  Enc Vitals Group     BP 03/17/15 0402 111/75 mmHg     Pulse Rate 03/17/15 0402 148     Resp 03/17/15 0402 18     Temp --      Temp src --      SpO2 03/17/15 0402 95 %     Weight 03/17/15 0312 150 lb (68.04 kg)     Height 03/17/15 0312 5\' 6"  (1.676 m)     Head Cir --      Peak Flow --      Pain Score 03/17/15 0312 8     Pain Loc --      Pain Edu? --      Excl. in GC? --      Constitutional: Alert and oriented. Well appearing and in no distress. Eyes: Conjunctivae are normal. PERRL. Normal extraocular movements. ENT   Head: Normocephalic and atraumatic.   Nose: No congestion/rhinnorhea.   Mouth/Throat: Mucous membranes are moist.   Neck: No stridor. Hematological/Lymphatic/Immunilogical: No cervical lymphadenopathy. Cardiovascular: Tachycardia irregular irregular rhythm .Normal and symmetric distal pulses are present in all extremities. No murmurs, rubs, or gallops. Respiratory: Normal respiratory effort without tachypnea nor retractions. Breath sounds are clear and equal bilaterally. No wheezes/rales/rhonchi. Gastrointestinal: Soft and nontender. No distention. There is no CVA tenderness. Genitourinary: deferred Musculoskeletal: Nontender with normal range of motion in all extremities. No joint effusions.  No lower extremity tenderness nor edema. Neurologic:  Normal speech and language. No gross  focal neurologic deficits are appreciated. Speech is normal.  Skin:  Skin is warm,  dry and intact. No rash noted. Psychiatric: Mood and affect are normal. Speech and behavior are normal. Patient exhibits appropriate insight and judgment.  ____________________________________________    LABS (pertinent positives/negatives)  Labs Reviewed  BASIC METABOLIC PANEL - Abnormal; Notable for the following:    Glucose, Bld 162 (*)    BUN 37 (*)    Creatinine, Ser 1.52 (*)    GFR calc non Af Amer 32 (*)    GFR calc Af Amer 37 (*)    All other components within normal limits  CBC  TROPONIN I     ____________________________________________   EKG  ED ECG REPORT I, BROWN, [place] N, the attending physician, personally viewed and interpreted this ECG.   Date: 03/17/2015  EKG Time: 3:10 AM  Rate: 148  Rhythm: Atrial fibrillation with rapid ventricular response  Axis: None  Intervals:  ST&T Change: Inferior lateral ST segment depression   ____________________________________________    RADIOLOGY       DG Chest 2 View (Final result) Result time: 03/17/15 03:28:38   Final result by Rad Results In Interface (03/17/15 03:28:38)   Narrative:   CLINICAL DATA: Chest pain, palpitations, and shortness of breath.  EXAM: CHEST 2 VIEW  COMPARISON: 11/13/2011  FINDINGS: Central interstitial pattern suggesting bronchitic changes. Normal heart size and pulmonary vascularity. No focal airspace disease or consolidation in the lungs. No blunting of costophrenic angles. No pneumothorax. Mediastinal contours appear intact. Calcified and tortuous aorta. Probable Coronary artery calcifications. Degenerative changes in the spine.  IMPRESSION: No evidence of active pulmonary disease. Central interstitial changes suggesting bronchitic change.   Electronically Signed By: Burman Nieves M.D. On: 03/17/2015 03:28   Critical care:CRITICAL CARE Performed by: Bayard Males  N   Total critical care time: 30 minutes  Critical care time was exclusive of separately billable procedures and treating other patients.  Critical care was necessary to treat or prevent imminent or life-threatening deterioration.  Critical care was time spent personally by me on the following activities: development of treatment plan with patient and/or surrogate as well as nursing, discussions with consultants, evaluation of patient's response to treatment, examination of patient, obtaining history from patient or surrogate, ordering and performing treatments and interventions, ordering and review of laboratory studies, ordering and review of radiographic studies, pulse oximetry and re-evaluation of patient's condition.     INITIAL IMPRESSION / ASSESSMENT AND PLAN / ED COURSE  Pertinent labs & imaging results that were available during my care of the patient were reviewed by me and considered in my medical decision making (see chart for details).  History of physical exam consistent with age of fibrillation with rapid ventricular response with a heart rate as high as 150s on presentation to the room. Patient received Cardizem 10 mg IV with improvement of heart rate to 90-115. Patient received a additional dose of Cardizem 10 mg. With rate control achieved. We'll continue to obtain serial cardiac enzymes given presentation of chest discomfort. Concern for demand ischemia versus infarct. Patient discussed with Dr. Sheryle Hail for hospital admission.  ____________________________________________   FINAL CLINICAL IMPRESSION(S) / ED DIAGNOSES  Final diagnoses:  Atrial fibrillation with rapid ventricular response (HCC)  Chest pain, unspecified chest pain type      Darci Current, MD 03/22/15 [phone]

## 2015-03-17 NOTE — ED Notes (Signed)
2 failed attempts at securing IV access by this RN. Lowry RamBryan G, RN in to attempt IV access at this time.

## 2015-03-17 NOTE — ED Notes (Signed)
Butch, RN gave report to Rinaldo CloudPamela, RN CCU. This nurse spoke with Rinaldo CloudPamela, RN about bed assignment and 1 IV line.

## 2015-03-17 NOTE — ED Notes (Signed)
10 mg of Cardizem given IVP. 

## 2015-03-17 NOTE — Progress Notes (Signed)
CRITICAL VALUE ALERT  Critical value received:  Troponin 0.16  Date of notification:  03/17/2015  Time of notification:  09:50  Critical value read back:Yes.    Nurse who received alert:  Zenon MayoJoanna RN  MD notified (1st page):  Dr. Renae GlossWieting  Time of first page:  09:58  MD notified (2nd page):  Time of second page:  Responding MD:  Dr. Renae GlossWieting   Time MD responded:  09:58  Dr. Renae GlossWieting notified of elevated troponin. Will continue to assess and monitor.

## 2015-03-17 NOTE — ED Notes (Signed)
Pt presents to ED with chest pain and palpitations and shortness of breath. Pt states she was woken from sleep due to her pain. No hx of the same. Pt is alert and calm at this time with no increased work of breathing or acute distress noted. Pt also c/o back and bilateral arm pain.

## 2015-03-17 NOTE — ED Notes (Signed)
Brown, MD and RN at bedside. 

## 2015-03-18 LAB — BASIC METABOLIC PANEL
ANION GAP: 4 — AB (ref 5–15)
BUN: 31 mg/dL — ABNORMAL HIGH (ref 6–20)
CALCIUM: 9.2 mg/dL (ref 8.9–10.3)
CO2: 24 mmol/L (ref 22–32)
Chloride: 114 mmol/L — ABNORMAL HIGH (ref 101–111)
Creatinine, Ser: 1.41 mg/dL — ABNORMAL HIGH (ref 0.44–1.00)
GFR, EST AFRICAN AMERICAN: 40 mL/min — AB (ref >60)
GFR, EST NON AFRICAN AMERICAN: 35 mL/min — AB (ref >60)
Glucose, Bld: 139 mg/dL — ABNORMAL HIGH (ref 65–99)
Potassium: 4.4 mmol/L (ref 3.5–5.1)
SODIUM: 142 mmol/L (ref 135–145)

## 2015-03-18 LAB — MAGNESIUM: MAGNESIUM: 1.7 mg/dL (ref 1.7–2.4)

## 2015-03-18 LAB — GLUCOSE, CAPILLARY: Glucose-Capillary: 138 mg/dL — ABNORMAL HIGH (ref 65–99)

## 2015-03-18 MED ORDER — CAPSAICIN 0.025 % EX CREA
TOPICAL_CREAM | Freq: Two times a day (BID) | CUTANEOUS | Status: DC
  Administered 2015-03-18 – 2015-03-21 (×6): via TOPICAL
  Filled 2015-03-18 (×2): qty 56.6

## 2015-03-18 NOTE — Progress Notes (Signed)
Pt accepted in transfer from [facility]. She is a/o. Painfree. Tele shows nsr. Still somewhat queasy and w/o appetite. Taking gingerale.

## 2015-03-18 NOTE — Progress Notes (Signed)
Patient is alert and oriented. Reporting tolerable pain control after PRN medications x 2 for left knee pain. SR/SB on monitor. VSS. [facility] and have [facility] today. Patient reporting no appetite, poor PO intake at breakfast and lunch. To be transferred to 2A, report called to SidneySusan. Will transport shortly.

## 2015-03-18 NOTE — Progress Notes (Signed)
[facility]  SUBJECTIVE: no complaints at present. Had prolonged pause when converting from aflutter to nsr consistnat with delayed atrial recovery time. Currently stable in nsr. Cardizm drip stopped.    Filed Vitals:   03/18/15 0700 03/18/15 0726 03/18/15 0800 03/18/15 0900  BP: 135/73  139/70 157/78  Pulse: 58 60 64 61  Temp:  97.7 F (36.5 C)    TempSrc:      Resp: 21 19 23 16   Height:      Weight:      SpO2: 100% 99% 100% 100%    Intake/Output Summary (Last 24 hours) at 03/18/15 1039 Last data filed at 03/17/15 2300  Gross per 24 hour  Intake 1455.25 ml  Output    400 ml  Net 1055.25 ml    LABS: Basic Metabolic Panel:  Recent Labs  16/02/9610/04/16 0348 03/18/15 0255  NA 139 142  K 4.0 4.4  CL 107 114*  CO2 24 24  GLUCOSE 162* 139*  BUN 37* 31*  CREATININE 1.52* 1.41*  CALCIUM 9.6 9.2  MG  --  1.7   Liver Function Tests: No results for input(s): AST, ALT, ALKPHOS, BILITOT, PROT, ALBUMIN in the last 72 hours. No results for input(s): LIPASE, AMYLASE in the last 72 hours. CBC:  Recent Labs  03/17/15 0348  WBC 8.1  HGB 13.3  HCT 41.1  MCV 86.6  PLT 203   Cardiac Enzymes:  Recent Labs  03/17/15 0835 03/17/15 1435 03/17/15 2049  TROPONINI 0.16* 0.23* 0.24*   BNP: Invalid input(s): POCBNP D-Dimer: No results for input(s): DDIMER in the last 72 hours. Hemoglobin A1C: No results for input(s): HGBA1C in the last 72 hours. Fasting Lipid Panel: No results for input(s): CHOL, HDL, LDLCALC, TRIG, CHOLHDL, LDLDIRECT in the last 72 hours. Thyroid Function Tests:  Recent Labs  03/17/15 0348  TSH 0.955   Anemia Panel: No results for input(s): VITAMINB12, FOLATE, FERRITIN, TIBC, IRON, RETICCTPCT in the last 72 hours.   Physical Exam: Blood pressure 157/78, pulse 61, temperature 97.7 F (36.5 C), temperature source Oral, resp. rate 16, height 5\' 6"  (1.676 m), weight 69.7 kg (153 lb 10.6 oz), SpO2 100 %.    General  appearance: alert and cooperative Resp: clear to auscultation bilaterally Cardio: regular rate and rhythm GI: soft, non-tender; bowel sounds normal; no masses,  no organomegaly Neurologic: Grossly normal  TELEMETRY: Reviewed telemetry pt in aflutter converted to nsr with pause  ASSESSMENT AND PLAN:  Active Problems:   Atrial fibrillation with RVR (HCC)-converted to nsr with prolonged pause consistent with delayed atrial recovery time. Will remain off of cardizem for now but may need to resume to attempt to maintain nsr. Remain off of clonidine  For now. Conitnue with apixiban for anticoagulation    Dalia HeadingFATH,Amaya Blakeman A., MD, [facility] 03/18/2015 10:39 AM

## 2015-03-18 NOTE — Progress Notes (Signed)
Running atrial flutter.  Experienced 7 second pause.  Rhythm restarted as Sinus Rhythm.  MD notified.  Awaiting new orders.

## 2015-03-18 NOTE — Progress Notes (Signed)
Patient ID: Wendy Key   DOB: 16-Mar-1936, 79 y.o.   MRN: [id number] [facility] PROGRESS NOTE  PCP: Pcp Not In System  HPI/Subjective:  Last night had a 3 second pause is on metoprolol and clonidine. Heart rate controlled   Objective: Filed Vitals:   03/18/15 0900  BP: 157/78  Pulse: 61  Temp:   Resp: 16    Filed Weights   03/17/15 0312 03/17/15 0800  Weight: 68.04 kg (150 lb) 69.7 kg (153 lb 10.6 oz)    ROS: Review of Systems  Constitutional: Negative for fever and chills.  Eyes: Negative for blurred vision.  Respiratory: Positive for shortness of breath. Negative for cough.   Cardiovascular: Negative for chest pain.  Gastrointestinal: Negative for nausea, vomiting, abdominal pain, diarrhea and constipation.  Genitourinary: Negative for dysuria.  Musculoskeletal: Negative for joint pain.  Neurological: Negative for dizziness and headaches.   Exam: Physical Exam  Constitutional: She is oriented to person, place, and time.  HENT:  Nose: No mucosal edema.  Mouth/Throat: No oropharyngeal exudate or posterior oropharyngeal edema.  Eyes: Conjunctivae, EOM and lids are normal. Pupils are equal, round, and reactive to light.  Neck: No JVD present. Carotid bruit is not present. No edema present. No thyroid mass and no thyromegaly present.  Cardiovascular: S1 normal and S2 normal.  An irregularly irregular rhythm present. Exam reveals no gallop.   Murmur heard.  Systolic murmur is present with a grade of 2/6  Pulses:      Dorsalis pedis pulses are 2+ on the right side, and 2+ on the left side.  Respiratory: No respiratory distress. She has no wheezes. She has no rhonchi. She has no rales.  GI: Soft. Bowel sounds are normal. There is no tenderness.  Musculoskeletal:       Right ankle: She exhibits no swelling.       Left ankle: She exhibits no swelling.  Lymphadenopathy:    She has no cervical adenopathy.  Neurological: She is alert and  oriented to person, place, and time. No cranial nerve deficit.  Skin: Skin is warm. No rash noted. Nails show no clubbing.  Psychiatric: She has a normal mood and affect.    Data Reviewed: Basic Metabolic Panel:  Recent Labs Lab 03/17/15 0348 03/18/15 0255  NA 139 142  K 4.0 4.4  CL 107 114*  CO2 24 24  GLUCOSE 162* 139*  BUN 37* 31*  CREATININE 1.52* 1.41*  CALCIUM 9.6 9.2  MG  --  1.7   CBC:  Recent Labs Lab 03/17/15 0348  WBC 8.1  HGB 13.3  HCT 41.1  MCV 86.6  PLT 203   Cardiac Enzymes:  Recent Labs Lab 03/17/15 0348 03/17/15 0835 03/17/15 1435 03/17/15 2049  TROPONINI <0.03 0.16* 0.23* 0.24*    CBG:  Recent Labs Lab 03/17/15 0804  GLUCAP 110*    Recent Results (from the past 240 hour(s))  MRSA PCR Screening     Status: None   Collection Time: 03/17/15  8:20 AM  Result Value Ref Range Status   MRSA by PCR NEGATIVE NEGATIVE Final    Comment:        The GeneXpert MRSA Assay (FDA approved for NASAL specimens only), is one component of a comprehensive MRSA colonization surveillance program. It is not intended to diagnose MRSA infection nor to guide or monitor treatment for MRSA infections.      Studies: Dg Chest 2 View  03/17/2015  CLINICAL DATA:  Chest pain, palpitations, and  shortness of breath. EXAM: CHEST  2 VIEW COMPARISON:  11/13/2011 FINDINGS: Central interstitial pattern suggesting bronchitic changes. Normal heart size and pulmonary vascularity. No focal airspace disease or consolidation in the lungs. No blunting of costophrenic angles. No pneumothorax. Mediastinal contours appear intact. Calcified and tortuous aorta. Probable Coronary artery calcifications. Degenerative changes in the spine. IMPRESSION: No evidence of active pulmonary disease. Central interstitial changes suggesting bronchitic change. Electronically Signed   By: Burman NievesWilliam  Stevens M.D.   On: 03/17/2015 03:28    Scheduled Meds: . apixaban  2.5 mg Oral BID  .  calcium-vitamin D  1 tablet Oral Q breakfast  . capsaicin   Topical BID  . chlorthalidone  25 mg Oral Daily  . docusate sodium  100 mg Oral BID  . enalapril  20 mg Oral BID  . famotidine  20 mg Oral Daily  . heparin  5,000 Units Subcutaneous 3 times per day  . mometasone-formoterol  2 puff Inhalation BID  . mycophenolate  360 mg Oral BID  . pravastatin  40 mg Oral Daily  . predniSONE  5 mg Oral Daily  . sodium chloride  3 mL Intravenous Q12H  . spironolactone  25 mg Oral Daily  . tacrolimus  2 mg Oral BID  . tiotropium  18 mcg Inhalation Daily   Continuous Infusions:    Assessment/Plan:  1. Atrial fibrillation with rapid ventricular response. Patient initially started on Cardizem drip and admitted to the CCU. Due to pauses discontinue metoprolol and clonidine. Monitor heart rate patient started on eliquis 2. Long pause of 12 seconds on telemetry monitoring.  Discontinue metoprolol and clonidine may need a lower dose metoprolol . May need a pacemaker 3. Elevated troponin demand ischemia from rapid heart rate 4. History of kidney transplant- continue immunosuppressive medications 5. Essential hypertension- continue usual medications 6. Type 2 diabetes mellitus- diet controlled 7. Hyperlipidemia unspecified continue pravastatin  Code Status:     Code Status Orders        Start     Ordered   03/17/15 0818  Full code   Continuous     03/17/15 0817     Family Communication: Family at bedside Disposition Plan: Home soon  Consultants:  Cardiology  Time spent: 35 minutes  Chaz Ronning, [facility]  [facility]

## 2015-03-19 MED ORDER — DILTIAZEM HCL 30 MG PO TABS
30.0000 mg | ORAL_TABLET | Freq: Four times a day (QID) | ORAL | Status: DC
  Administered 2015-03-19 (×2): 30 mg via ORAL
  Filled 2015-03-19 (×2): qty 1

## 2015-03-19 MED ORDER — DILTIAZEM HCL 25 MG/5ML IV SOLN
10.0000 mg | Freq: Once | INTRAVENOUS | Status: AC
  Administered 2015-03-19: 10 mg via INTRAVENOUS
  Filled 2015-03-19: qty 5

## 2015-03-19 MED ORDER — DILTIAZEM HCL 60 MG PO TABS
60.0000 mg | ORAL_TABLET | Freq: Four times a day (QID) | ORAL | Status: DC
  Administered 2015-03-19 – 2015-03-20 (×3): 60 mg via ORAL
  Filled 2015-03-19 (×3): qty 1

## 2015-03-19 MED ORDER — APIXABAN 5 MG PO TABS
5.0000 mg | ORAL_TABLET | Freq: Two times a day (BID) | ORAL | Status: DC
  Administered 2015-03-19 – 2015-03-21 (×4): 5 mg via ORAL
  Filled 2015-03-19 (×4): qty 1

## 2015-03-19 MED ORDER — PNEUMOCOCCAL VAC POLYVALENT 25 MCG/0.5ML IJ INJ
0.5000 mL | INJECTION | INTRAMUSCULAR | Status: DC
  Filled 2015-03-19: qty 0.5

## 2015-03-19 MED ORDER — DIGOXIN 0.25 MG/ML IJ SOLN
0.1250 mg | Freq: Once | INTRAMUSCULAR | Status: AC
  Administered 2015-03-19: 0.125 mg via INTRAVENOUS
  Filled 2015-03-19: qty 0.5

## 2015-03-19 NOTE — Progress Notes (Signed)
Patient ID: Wendy Key, female   DOB: 1936/04/19, 79 y.o.   MRN: [id number] [facility] PROGRESS NOTE  PCP: Pcp Not In System  HPI/Subjective:  Further pauses, heart rate elevated in the 150s earlier today, now improved   Objective: Filed Vitals:   03/19/15 1228  BP: 151/91  Pulse: 107  Temp:   Resp:     Filed Weights   03/17/15 0312 03/17/15 0800 03/19/15 0501  Weight: 68.04 kg (150 lb) 69.7 kg (153 lb 10.6 oz) 69.7 kg (153 lb 10.6 oz)    ROS: Review of Systems  Constitutional: Negative for fever and chills.  Eyes: Negative for blurred vision.  Respiratory: Resolved shortness of breath. Negative for cough.   Cardiovascular: Negative for chest pain.  Gastrointestinal: Negative for nausea, vomiting, abdominal pain, diarrhea and constipation.  Genitourinary: Negative for dysuria.  Musculoskeletal: Negative for joint pain.  Neurological: Negative for dizziness and headaches.   Exam: Physical Exam  Constitutional: She is oriented to person, place, and time.  HENT:  Nose: No mucosal edema.  Mouth/Throat: No oropharyngeal exudate or posterior oropharyngeal edema.  Eyes: Conjunctivae, EOM and lids are normal. Pupils are equal, round, and reactive to light.  Neck: No JVD present. Carotid bruit is not present. No edema present. No thyroid mass and no thyromegaly present.  Cardiovascular: S1 normal and S2 normal.  An irregularly irregular rhythm present. Exam reveals no gallop.   Murmur heard.  Systolic murmur is present with a grade of 2/6  Pulses:      Dorsalis pedis pulses are 2+ on the right side, and 2+ on the left side.  Respiratory: No respiratory distress. She has no wheezes. She has no rhonchi. She has no rales.  GI: Soft. Bowel sounds are normal. There is no tenderness.  Musculoskeletal:       Right ankle: She exhibits no swelling.       Left ankle: She exhibits no swelling.  Lymphadenopathy:    She has no cervical adenopathy.  Neurological:  She is alert and oriented to person, place, and time. No cranial nerve deficit.  Skin: Skin is warm. No rash noted. Nails show no clubbing.  Psychiatric: She has a normal mood and affect.    Data Reviewed: Basic Metabolic Panel:  Recent Labs Lab 03/17/15 0348 03/18/15 0255  NA 139 142  K 4.0 4.4  CL 107 114*  CO2 24 24  GLUCOSE 162* 139*  BUN 37* 31*  CREATININE 1.52* 1.41*  CALCIUM 9.6 9.2  MG  --  1.7   CBC:  Recent Labs Lab 03/17/15 0348  WBC 8.1  HGB 13.3  HCT 41.1  MCV 86.6  PLT 203   Cardiac Enzymes:  Recent Labs Lab 03/17/15 0348 03/17/15 0835 03/17/15 1435 03/17/15 2049  TROPONINI <0.03 0.16* 0.23* 0.24*    CBG:  Recent Labs Lab 03/17/15 0804 03/18/15 1122  GLUCAP 110* 138*    Recent Results (from the past 240 hour(s))  MRSA PCR Screening     Status: None   Collection Time: 03/17/15  8:20 AM  Result Value Ref Range Status   MRSA by PCR NEGATIVE NEGATIVE Final    Comment:        The GeneXpert MRSA Assay (FDA approved for NASAL specimens only), is one component of a comprehensive MRSA colonization surveillance program. It is not intended to diagnose MRSA infection nor to guide or monitor treatment for MRSA infections.      Studies: No results found.  Scheduled Meds: .  apixaban  5 mg Oral BID  . calcium-vitamin D  1 tablet Oral Q breakfast  . capsaicin   Topical BID  . chlorthalidone  25 mg Oral Daily  . digoxin  0.125 mg Intravenous Once  . diltiazem  30 mg Oral 4 times per day  . docusate sodium  100 mg Oral BID  . enalapril  20 mg Oral BID  . famotidine  20 mg Oral Daily  . mometasone-formoterol  2 puff Inhalation BID  . mycophenolate  360 mg Oral BID  . pravastatin  40 mg Oral Daily  . predniSONE  5 mg Oral Daily  . sodium chloride  3 mL Intravenous Q12H  . spironolactone  25 mg Oral Daily  . tacrolimus  2 mg Oral BID  . tiotropium  18 mcg Inhalation Daily   Continuous Infusions:    Assessment/Plan:  1. Atrial  fibrillation with rapid ventricular response. Continue digoxin and diltiazem monitor heart rate 2. Long pause of 12 seconds on telemetry monitoring. Continue to monitor cardiology consult appreciated 3. Elevated troponin demand ischemia from rapid heart rate 4. History of kidney transplant- continue immunosuppressive medications 5. Essential hypertension- continue usual medications 6. Type 2 diabetes mellitus- diet controlled 7. Hyperlipidemia unspecified continue pravastatin  Code Status:     Code Status Orders        Start     Ordered   03/17/15 0818  Full code   Continuous     03/17/15 0817     Family Communication: Family at bedside Disposition Plan: Home soon  Consultants:  Cardiology  Time spent: 32 minutes  Verlyn Lambert, [facility]  [facility]

## 2015-03-19 NOTE — Progress Notes (Signed)
Dr. Allena KatzPatel and Dr. Lady GaryFath aware that patient remains in Afib with elevated heart rate. Want to see how she responds to these IV and PO medications. Will continue to monitor. Stated patient could possibly need a pacemaker. Patient has no complaints at this time.

## 2015-03-19 NOTE — Progress Notes (Signed)
[facility]  SUBJECTIVE: rapid heart rate   Filed Vitals:   03/18/15 2300 03/19/15 0433 03/19/15 0501 03/19/15 0809  BP: 166/64 175/81 164/64 168/97  Pulse:  89 83 150  Temp:  99.1 F (37.3 C)    TempSrc:  Oral    Resp:      Height:      Weight:   69.7 kg (153 lb 10.6 oz)   SpO2:  96%  94%    Intake/Output Summary (Last 24 hours) at 03/19/15 1002 Last data filed at 03/19/15 0959  Gross per 24 hour  Intake      0 ml  Output    600 ml  Net   -600 ml    LABS: Basic Metabolic Panel:  Recent Labs  40/98/1109/08/26 0348 03/18/15 0255  NA 139 142  K 4.0 4.4  CL 107 114*  CO2 24 24  GLUCOSE 162* 139*  BUN 37* 31*  CREATININE 1.52* 1.41*  CALCIUM 9.6 9.2  MG  --  1.7   Liver Function Tests: No results for input(s): AST, ALT, ALKPHOS, BILITOT, PROT, ALBUMIN in the last 72 hours. No results for input(s): LIPASE, AMYLASE in the last 72 hours. CBC:  Recent Labs  03/17/15 0348  WBC 8.1  HGB 13.3  HCT 41.1  MCV 86.6  PLT 203   Cardiac Enzymes:  Recent Labs  03/17/15 0835 03/17/15 1435 03/17/15 2049  TROPONINI 0.16* 0.23* 0.24*   BNP: Invalid input(s): POCBNP D-Dimer: No results for input(s): DDIMER in the last 72 hours. Hemoglobin A1C: No results for input(s): HGBA1C in the last 72 hours. Fasting Lipid Panel: No results for input(s): CHOL, HDL, LDLCALC, TRIG, CHOLHDL, LDLDIRECT in the last 72 hours. Thyroid Function Tests:  Recent Labs  03/17/15 0348  TSH 0.955   Anemia Panel: No results for input(s): VITAMINB12, FOLATE, FERRITIN, TIBC, IRON, RETICCTPCT in the last 72 hours.   Physical Exam: Blood pressure 168/97, pulse 150, temperature 99.1 F (37.3 C), temperature source Oral, resp. rate 18, height 5\' 6"  (1.676 m), weight 69.7 kg (153 lb 10.6 oz), SpO2 94 %.   General appearance: alert and cooperative Resp: clear to auscultation bilaterally Chest wall: no tenderness Cardio: irregularly irregular rhythm and  with tachycardia up to 150 bpm GI: soft, non-tender; bowel sounds normal; no masses,  no organomegaly Extremities: extremities normal, atraumatic, no cyanosis or edema Neurologic: Grossly normal  TELEMETRY: Reviewed telemetry pt in afib with rvr:  ASSESSMENT AND PLAN:  Active Problems:   Atrial fibrillation with RVR (HCC)-recurrent afib with rvr. Will place on po cardizem 30 q 6 and give a 10 mg iv bolus of cardizem to attempt to control rate. Will repeat iv bolus and/or use iv digozin if rate remains rapid. Will increase apixiban to 5 mg bid based on renal function.     Dalia HeadingFATH,Nalee Lightle A., MD, [facility] CenterFACC 03/19/2015 10:02 AM

## 2015-03-19 NOTE — Progress Notes (Signed)
Notified Dr. Lady GaryFath of patient's heart rate continuing to fluctuate 130's-160's. Order to change PO cardizem to 60mg  Q6h and continue to monitor.

## 2015-03-19 NOTE — Progress Notes (Signed)
Dr. Lady GaryFath notified that patient has gone back into Afib per tele monitor. MD to place medication orders.  Adella NissenBailey, Izaya Netherton G

## 2015-03-19 NOTE — Progress Notes (Signed)
Dr. Lady GaryFath notified that patient's HR continues to fluctuate 120-140's sometimes jumping up to 160 bpm. Patient is asymptomatic, sitting in the chair working on crossword puzzles. Order to give patient 0.125mg  IV digoxin once.

## 2015-03-19 NOTE — Progress Notes (Signed)
Dr. Lady GaryFath notified of HR 140-150s -Afib. Per MD, order 0.125 mg IV digoxin once.

## 2015-03-19 NOTE — Progress Notes (Signed)
Dr. Lady GaryFath notified pt still in Afib, heart rate now around 150's. Order to go ahead and give 10mg  IV push cardizem once.

## 2015-03-20 LAB — CBC
HCT: 42.8 % (ref 35.0–47.0)
HEMOGLOBIN: 13.7 g/dL (ref 12.0–16.0)
MCH: 27.4 pg (ref 26.0–34.0)
MCHC: 32 g/dL (ref 32.0–36.0)
MCV: 85.7 fL (ref 80.0–100.0)
Platelets: 208 10*3/uL (ref 150–440)
RBC: 4.99 MIL/uL (ref 3.80–5.20)
RDW: 14.4 % (ref 11.5–14.5)
WBC: 8.9 10*3/uL (ref 3.6–11.0)

## 2015-03-20 MED ORDER — DIGOXIN 0.25 MG/ML IJ SOLN
0.1250 mg | Freq: Once | INTRAMUSCULAR | Status: AC
  Administered 2015-03-20: 0.125 mg via INTRAVENOUS
  Filled 2015-03-20: qty 2

## 2015-03-20 MED ORDER — DILTIAZEM HCL 60 MG PO TABS
90.0000 mg | ORAL_TABLET | Freq: Four times a day (QID) | ORAL | Status: DC
  Administered 2015-03-20 – 2015-03-21 (×5): 90 mg via ORAL
  Filled 2015-03-20 (×5): qty 1

## 2015-03-20 MED ORDER — METOPROLOL TARTRATE 25 MG PO TABS
25.0000 mg | ORAL_TABLET | Freq: Two times a day (BID) | ORAL | Status: DC
  Administered 2015-03-20 (×2): 25 mg via ORAL
  Filled 2015-03-20 (×3): qty 1

## 2015-03-20 NOTE — Progress Notes (Signed)
Dr. Allena KatzPatel on floor making rounds. Notified of patient's heart rate trends overnight; beginning to jump back up to the 140's now that she is awake and up in the chair eating breakfast.

## 2015-03-20 NOTE — Progress Notes (Signed)
Patient has rested quietly tonight. No complaints of pain and no signs of discomfort or distress noted. HR stayed within the 110s-130s. Nursing staff will continue to monitor. Lamonte RicherKara A Delos Klich, RN

## 2015-03-20 NOTE — Progress Notes (Signed)
Patient's HR 70-80s now and she had a 1.38 second pause. Dr. Lady GaryFath notified. Instructed to continue to monitor and notify him if pauses begin approaching 3 seconds or if patient becomes symptomatic.

## 2015-03-20 NOTE — Progress Notes (Signed)
Dr. Lady GaryFath on unit - aware of HR 120-150s after receiving IV digoxin and PO cardizem

## 2015-03-20 NOTE — Progress Notes (Signed)
[facility]  SUBJECTIVE: hr controlled until when ambulating this am now had afib with rvr at rates of 140. Hemodynamically stable but aware of her heart rate. Mild sob with activity   Filed Vitals:   03/20/15 0500 03/20/15 0521 03/20/15 0628 03/20/15 0953  BP:  125/85  116/60  Pulse:  113  90  Temp:  97.7 F (36.5 C)    TempSrc:  Oral    Resp:  16    Height:      Weight: 68 kg (149 lb 14.6 oz)  67.178 kg (148 lb 1.6 oz)   SpO2:  98%      Intake/Output Summary (Last 24 hours) at 03/20/15 1050 Last data filed at 03/20/15 1011  Gross per 24 hour  Intake    480 ml  Output      0 ml  Net    480 ml    LABS: Basic Metabolic Panel:  Recent Labs  16/02/9610/05/16 0255  NA 142  K 4.4  CL 114*  CO2 24  GLUCOSE 139*  BUN 31*  CREATININE 1.41*  CALCIUM 9.2  MG 1.7   Liver Function Tests: No results for input(s): AST, ALT, ALKPHOS, BILITOT, PROT, ALBUMIN in the last 72 hours. No results for input(s): LIPASE, AMYLASE in the last 72 hours. CBC:  Recent Labs  03/20/15 0154  WBC 8.9  HGB 13.7  HCT 42.8  MCV 85.7  PLT 208   Cardiac Enzymes:  Recent Labs  03/17/15 1435 03/17/15 2049  TROPONINI 0.23* 0.24*   BNP: Invalid input(s): POCBNP D-Dimer: No results for input(s): DDIMER in the last 72 hours. Hemoglobin A1C: No results for input(s): HGBA1C in the last 72 hours. Fasting Lipid Panel: No results for input(s): CHOL, HDL, LDLCALC, TRIG, CHOLHDL, LDLDIRECT in the last 72 hours. Thyroid Function Tests: No results for input(s): TSH, T4TOTAL, T3FREE, THYROIDAB in the last 72 hours.  Invalid input(s): FREET3 Anemia Panel: No results for input(s): VITAMINB12, FOLATE, FERRITIN, TIBC, IRON, RETICCTPCT in the last 72 hours.   Physical Exam: Blood pressure 116/60, pulse 90, temperature 97.7 F (36.5 C), temperature source Oral, resp. rate 16, height 5\' 6"  (1.676 m), weight 67.178 kg (148 lb 1.6 oz), SpO2 98 %.    General appearance:  alert and cooperative Resp: clear to auscultation bilaterally Cardio: irregularly irregular rhythm GI: soft, non-tender; bowel sounds normal; no masses,  no organomegaly Neurologic: Grossly normal  TELEMETRY: Reviewed telemetry pt in afaib with rvr. :  ASSESSMENT AND PLAN:  Active Problems:   Atrial fibrillation with RVR (HCC)- rate well controlled until this am now with afib with rvr. Agree with inicreasing cardizem to 90 q 6 and with digoxin iv bolus. Will follow for pauses or bradycardia. If brady recurs will need back up pacing for tachy bradyc syndrome.     Dalia HeadingFATH,Jaydon Avina A., MD, [facility] CenterFACC 03/20/2015 10:50 AM

## 2015-03-20 NOTE — Progress Notes (Signed)
Patient ID: Wendy Key, female   DOB: January 02, 1936, 79 y.o.   [id number] [facility]  PCP: Pcp Not In System  HPI/Subjective:  Patient heart rate continues to be elevated. No further pauses noted   Objective: Filed Vitals:   03/20/15 1303  BP: 123/76  Pulse: 117  Temp:   Resp:     Filed Weights   03/19/15 0501 03/20/15 0500 03/20/15 0628  Weight: 69.7 kg (153 lb 10.6 oz) 68 kg (149 lb 14.6 oz) 67.178 kg (148 lb 1.6 oz)    ROS: Review of Systems  Constitutional: Negative for fever and chills.  Eyes: Negative for blurred vision.  Respiratory: Resolved shortness of breath. Negative for cough.   Cardiovascular: Negative for chest pain.  Gastrointestinal: Negative for nausea, vomiting, abdominal pain, diarrhea and constipation.  Genitourinary: Negative for dysuria.  Musculoskeletal: Negative for joint pain.  Neurological: Negative for dizziness and headaches.   Exam: Physical Exam  Constitutional: She is oriented to person, place, and time.  HENT:  Nose: No mucosal edema.  Mouth/Throat: No oropharyngeal exudate or posterior oropharyngeal edema.  Eyes: Conjunctivae, EOM and lids are normal. Pupils are equal, round, and reactive to light.  Neck: No JVD present. Carotid bruit is not present. No edema present. No thyroid mass and no thyromegaly present.  Cardiovascular: S1 normal and S2 normal.  An irregularly irregular rhythm present. Exam reveals no gallop.   Murmur heard.  Systolic murmur is present with a grade of 2/6  Pulses:      Dorsalis pedis pulses are 2+ on the right side, and 2+ on the left side.  Respiratory: No respiratory distress. She has no wheezes. She has no rhonchi. She has no rales.  GI: Soft. Bowel sounds are normal. There is no tenderness.  Musculoskeletal:       Right ankle: She exhibits no swelling.       Left ankle: She exhibits no swelling.  Lymphadenopathy:    She has no cervical adenopathy.  Neurological:  She is alert and oriented to person, place, and time. No cranial nerve deficit.  Skin: Skin is warm. No rash noted. Nails show no clubbing.  Psychiatric: She has a normal mood and affect.    Data Reviewed: Basic Metabolic Panel:  Recent Labs Lab 03/17/15 0348 03/18/15 0255  NA 139 142  K 4.0 4.4  CL 107 114*  CO2 24 24  GLUCOSE 162* 139*  BUN 37* 31*  CREATININE 1.52* 1.41*  CALCIUM 9.6 9.2  MG  --  1.7   CBC:  Recent Labs Lab 03/17/15 0348 03/20/15 0154  WBC 8.1 8.9  HGB 13.3 13.7  HCT 41.1 42.8  MCV 86.6 85.7  PLT 203 208   Cardiac Enzymes:  Recent Labs Lab 03/17/15 0348 03/17/15 0835 03/17/15 1435 03/17/15 2049  TROPONINI <0.03 0.16* 0.23* 0.24*    CBG:  Recent Labs Lab 03/17/15 0804 03/18/15 1122  GLUCAP 110* 138*    Recent Results (from the past 240 hour(s))  MRSA PCR Screening     Status: None   Collection Time: 03/17/15  8:20 AM  Result Value Ref Range Status   MRSA by PCR NEGATIVE NEGATIVE Final    Comment:        The GeneXpert MRSA Assay (FDA approved for NASAL specimens only), is one component of a comprehensive MRSA colonization surveillance program. It is not intended to diagnose MRSA infection nor to guide or monitor treatment for MRSA infections.  Studies: No results found.  Scheduled Meds: . apixaban  5 mg Oral BID  . calcium-vitamin D  1 tablet Oral Q breakfast  . capsaicin   Topical BID  . chlorthalidone  25 mg Oral Daily  . diltiazem  90 mg Oral 4 times per day  . docusate sodium  100 mg Oral BID  . enalapril  20 mg Oral BID  . famotidine  20 mg Oral Daily  . metoprolol tartrate  25 mg Oral BID  . mometasone-formoterol  2 puff Inhalation BID  . mycophenolate  360 mg Oral BID  . pneumococcal 23 valent vaccine  0.5 mL Intramuscular Tomorrow-1000  . pravastatin  40 mg Oral Daily  . predniSONE  5 mg Oral Daily  . sodium chloride  3 mL Intravenous Q12H  . spironolactone  25 mg Oral Daily  . tacrolimus  2 mg  Oral BID  . tiotropium  18 mcg Inhalation Daily   Continuous Infusions:    Assessment/Plan:  1. Atrial fibrillation with rapid ventricular response. Increase dose of Cardizem also 1 dose of IV digoxin will be given continue lower dose metoprolol and follow heart rate if he she has recurrent pauses then will need a pacemaker per cardiology 2. Long pause of 12 seconds on telemetry monitoring. Continue to monitor cardiology consult appreciated 3. Elevated troponin demand ischemia from rapid heart rate 4. History of kidney transplant- continue immunosuppressive medications 5. Essential hypertension- continue usual medications 6. Type 2 diabetes mellitus- diet controlled 7. Hyperlipidemia unspecified continue pravastatin  Code Status:     Code Status Orders        Start     Ordered   03/17/15 0818  Full code   Continuous     03/17/15 0817     Family Communication: Family at bedside Disposition Plan: Home soon  Consultants:  Cardiology  Time spent: 22 minutes  Dahiana Kulak, [facility]  [facility]

## 2015-03-21 LAB — CREATININE, SERUM
CREATININE: 2.12 mg/dL — AB (ref 0.44–1.00)
GFR, EST AFRICAN AMERICAN: 25 mL/min — AB (ref >60)
GFR, EST NON AFRICAN AMERICAN: 21 mL/min — AB (ref >60)

## 2015-03-21 MED ORDER — RIVAROXABAN 15 MG PO TABS: 15.0000 mg | ORAL_TABLET | Freq: Every day | ORAL | Status: AC

## 2015-03-21 MED ORDER — METOPROLOL TARTRATE 25 MG PO TABS: 25.0000 mg | ORAL_TABLET | Freq: Two times a day (BID) | ORAL | Status: DC

## 2015-03-21 MED ORDER — DILTIAZEM HCL ER COATED BEADS 240 MG PO TB24: 240.0000 mg | ORAL_TABLET | Freq: Every day | ORAL | Status: AC

## 2015-03-21 NOTE — Progress Notes (Signed)
[facility]  SUBJECTIVE: Heart rate is better.  Patient is somewhat less short of breath.  Discuss consideration for discharge.  Patient is on concerned about symptoms.  Asked to be considered for a Life Alert.  Have relayed this information to care management.   Filed Vitals:   03/21/15 0544 03/21/15 0627 03/21/15 0927 03/21/15 1315  BP: 127/52  111/53 126/63  Pulse: 49  75 97  Temp:   98.2 F (36.8 C) 98.7 F (37.1 C)  TempSrc:   Oral Oral  Resp:   17 18  Height:      Weight:  67.405 kg (148 lb 9.6 oz)    SpO2:   98% 95%    Intake/Output Summary (Last 24 hours) at 03/21/15 1419 Last data filed at 03/21/15 1300  Gross per 24 hour  Intake    240 ml  Output      0 ml  Net    240 ml    LABS: Basic Metabolic Panel:  Recent Labs  13/24/4009/12/26 0403  CREATININE 2.12*   Liver Function Tests: No results for input(s): AST, ALT, ALKPHOS, BILITOT, PROT, ALBUMIN in the last 72 hours. No results for input(s): LIPASE, AMYLASE in the last 72 hours. CBC:  Recent Labs  03/20/15 0154  WBC 8.9  HGB 13.7  HCT 42.8  MCV 85.7  PLT 208   Cardiac Enzymes: No results for input(s): CKTOTAL, CKMB, CKMBINDEX, TROPONINI in the last 72 hours. BNP: Invalid input(s): POCBNP D-Dimer: No results for input(s): DDIMER in the last 72 hours. Hemoglobin A1C: No results for input(s): HGBA1C in the last 72 hours. Fasting Lipid Panel: No results for input(s): CHOL, HDL, LDLCALC, TRIG, CHOLHDL, LDLDIRECT in the last 72 hours. Thyroid Function Tests: No results for input(s): TSH, T4TOTAL, T3FREE, THYROIDAB in the last 72 hours.  Invalid input(s): FREET3 Anemia Panel: No results for input(s): VITAMINB12, FOLATE, FERRITIN, TIBC, IRON, RETICCTPCT in the last 72 hours.   Physical Exam: Blood pressure 126/63, pulse 97, temperature 98.7 F (37.1 C), temperature source Oral, resp. rate 18, height 5\' 6"  (1.676 m), weight 67.405 kg (148 lb 9.6 oz), SpO2 95 %.     General appearance: alert and cooperative Resp: clear to auscultation bilaterally Cardio: irregularly irregular rhythm GI: soft, non-tender; bowel sounds normal; no masses,  no organomegaly Extremities: extremities normal, atraumatic, no cyanosis or edema Neurologic: Grossly normal  TELEMETRY: Reviewed telemetry pt in atrial fibrillation with good rate control:  ASSESSMENT AND PLAN:  Active Problems:   Atrial fibrillation with RVR (HCC) - rate in better control.  Would  Ambulate on current regimen including spironolactone at 25 mg daily, metoprolol tartrate 25 mg twice daily, diltiazem 90 mg 4 times daily, chlorthalidone 25 mg daily.  Given family concerns, would discharge with 48 hour Holter monitor and set up with life alert.  Will follow-up in our office and schedule 30 day event monitor if Holter monitor is not adequate to determine consistent rate control.  Further recommendations will be based on the results.  Would continue with apixaban at 5 mg twice daily for anticoagulation.    Dalia HeadingFATH,KENNETH A., MD, [facility] 03/21/2015 2:19 PM

## 2015-03-21 NOTE — Care Management (Signed)
Patient to be discharged to home today.  Patient obtains her medication from [facility] in [place].  [place] stated the drug is a $0 copay.  Order placed for rolling walker.  Will from [facility] notified.  Family request information on "life alert" pamphlet on Med Alert provided to patient's son.  Son to call for pricing information.  RNCM signing off

## 2015-03-21 NOTE — Progress Notes (Signed)
Patient has rested quietly tonight. No complaints of pain and no signs of discomfort or distress noted. HR stayed in the 60s-80s and no pauses occurred. Nursing staff will continue to monitor. Lamonte RicherKara A [place] Torok, RN

## 2015-03-21 NOTE — Progress Notes (Signed)
Pt is a&o, VSS, Aflutter on tele, with no complaints of pain or discomfort. Order to d/c pt to home. Info on Life alert provided by CM and 48hr Holter monitor placed by Cardio Pulmonary. Follow up with [facility] made for Friday per telephone order. Discharge instructions given to pt and son with verbal acknowledgment of understanding. IV and tele removed and pt escorted off unit via wheelchair by nursing.

## 2015-03-21 NOTE — Discharge Summary (Signed)
Wendy Key, 79 y.o., DOB [birth year], MRN [id number]. Admission date: 03/17/2015 Discharge Date 03/21/2015 Primary MD Pcp Not In System Admitting Physician Arnaldo Natal, MD  Admission Diagnosis  Atrial fibrillation with rapid ventricular response [name]) [I48.91] Chest pain, unspecified chest pain type [R07.9]  Discharge Diagnosis   Active Problems:   Atrial fibrillation with RVR (HCC)  sinus pause Elevated troponin due to demand ischemia History of kidney trans- Essential hypertension Type 2 diabetes Appendectomy lipidemia nonspecified        Hospital Course  Patient presents emergency department after awakening with palpitations and chest pain. Patient came to the emergency room with these symptoms and what is noted to have atrial fibrillation with rapid ventricular rate. She was placed on Cardizem drip. And was watched in ICU. She was noted to have a initially 12 second pause. Her Cardizem drip was discontinued and she was kept on oral medications. She had another 3 second pause next day therefore her beta blocker was discontinued. Subsequently her heart rate started to increase. Her Cardizem was slowly increased and a low-dose beta blocker was added. Patient's heart rate is now stabilized within the past 24 hours there is no evidence of any further pauses. She was seen by cardiology who did not feel that patient warranted a pacemaker at this point but in the future may need a pacemaker. Patient will have a 48 hour monitor and then a 30 day event monitor will be determined. We'll also started on anticoagulation for atrial fibrillation.          Consults  cardiology  Significant Tests:  See full reports for all details    Dg Chest 2 View  03/17/2015  CLINICAL DATA:  Chest pain, palpitations, and shortness of breath. EXAM: CHEST  2 VIEW COMPARISON:  11/13/2011 FINDINGS: Central interstitial pattern suggesting bronchitic changes. Normal heart size and pulmonary  vascularity. No focal airspace disease or consolidation in the lungs. No blunting of costophrenic angles. No pneumothorax. Mediastinal contours appear intact. Calcified and tortuous aorta. Probable Coronary artery calcifications. Degenerative changes in the spine. IMPRESSION: No evidence of active pulmonary disease. Central interstitial changes suggesting bronchitic change. Electronically Signed   By: Burman Nieves M.D.   On: 03/17/2015 03:28       Today   Subjective:   Wendy Key  feels well denies any complaints.  Objective:   Blood pressure 126/63, pulse 97, temperature 98.7 F (37.1 C), temperature source Oral, resp. rate 18, height  (1.676 m), weight 67.405 kg (148 lb 9.6 oz), SpO2 95 %.  .  Intake/Output Summary (Last 24 hours) at 03/21/15 1455 Last data filed at 03/21/15 1300  Gross per 24 hour  Intake    240 ml  Output      0 ml  Net    240 ml    Exam VITAL SIGNS: Blood pressure 126/63, pulse 97, temperature 98.7 F (37.1 C), temperature source Oral, resp. rate 18, height  (1.676 m), weight 67.405 kg (148 lb 9.6 oz), SpO2 95 %.  GENERAL:  79 y.o.-year-old patient lying in the bed with no acute distress.  EYES: Pupils equal, round, reactive to light and accommodation. No scleral icterus. Extraocular muscles intact.  HEENT: Head atraumatic, normocephalic. Oropharynx and nasopharynx clear.  NECK:  Supple, no jugular venous distention. No thyroid enlargement, no tenderness.  LUNGS: Normal breath sounds bilaterally, no wheezing, rales,rhonchi or crepitation. No use of accessory muscles of respiration.  CARDIOVASCULAR:Irregularly regular  No murmurs, rubs, or gallops.  ABDOMEN:  Soft, nontender, nondistended. Bowel sounds present. No organomegaly or mass.  EXTREMITIES: No pedal edema, cyanosis, or clubbing.  NEUROLOGIC: Cranial nerves II through XII are intact. Muscle strength 5/5 in all extremities. Sensation intact. Gait not checked.  PSYCHIATRIC: The patient  is alert and oriented x 3.  SKIN: No obvious rash, lesion, or ulcer.   Data Review     CBC w Diff: Lab Results  Component Value Date   WBC 8.9 03/20/2015   WBC 5.6 04/20/2013   HGB 13.7 03/20/2015   HGB 12.2 04/20/2013   HCT 42.8 03/20/2015   HCT 37.5 04/20/2013   PLT 208 03/20/2015   PLT 185 04/20/2013   LYMPHOPCT 22.9 04/20/2013   LYMPHOPCT 22 11/13/2011   MONOPCT 7.1 04/20/2013   MONOPCT 9 11/13/2011   EOSPCT 1.3 04/20/2013   EOSPCT 3 11/13/2011   BASOPCT 0.8 04/20/2013   BASOPCT 0 11/13/2011   CMP: Lab Results  Component Value Date   NA 142 03/18/2015   NA 141 04/20/2013   K 4.4 03/18/2015   K 4.8 04/20/2013   CL 114* 03/18/2015   CL 108* 04/20/2013   CO2 24 03/18/2015   CO2 27 04/20/2013   BUN 31* 03/18/2015   BUN 26* 04/20/2013   CREATININE 2.12* 03/21/2015   CREATININE 1.41* 04/20/2013   PROT 7.3 04/20/2013   PROT 6.6 11/13/2011   ALBUMIN 4.0 04/20/2013   ALBUMIN 3.6 11/13/2011   BILITOT 0.5 04/20/2013   BILITOT 0.3 11/13/2011   ALKPHOS 67 04/20/2013   ALKPHOS 56 11/13/2011   AST 10* 04/20/2013   AST 12 11/13/2011   ALT 15 04/20/2013   ALT 9 11/13/2011  .  Micro Results Recent Results (from the past 240 hour(s))  MRSA PCR Screening     Status: None   Collection Time: 03/17/15  8:20 AM  Result Value Ref Range Status   MRSA by PCR NEGATIVE NEGATIVE Final    Comment:        The GeneXpert MRSA Assay (FDA approved for NASAL specimens only), is one component of a comprehensive MRSA colonization surveillance program. It is not intended to diagnose MRSA infection nor to guide or monitor treatment for MRSA infections.         Code Status Orders        Start     Ordered   03/17/15 0818  Full code   Continuous     03/17/15 0817          Follow-up Information    Follow up with pcp In 7 days.      Follow up with Dalia Heading., MD.   Specialty:  Cardiology   Why:  Friday, November 11th at 330pm, ccs   Contact information:    [address] [facility] [place] - CARDIOLOGY [place] [address] [phone]       Discharge Medications     Medication List    STOP taking these medications        cloNIDine 0.1 MG tablet  Commonly known as:  CATAPRES      TAKE these medications        CALCIUM PLUS VITAMIN D PO  Take 1 tablet by mouth daily.     chlorthalidone 25 MG tablet  Commonly known as:  HYGROTON  Take 25 mg by mouth daily.     clopidogrel 75 MG tablet  Commonly known as:  PLAVIX  Take 75 mg by mouth daily.     diltiazem 240 MG 24 hr tablet  Commonly known as:  CARDIZEM LA  Take 1 tablet (240 mg total) by mouth daily.     enalapril 20 MG tablet  Commonly known as:  VASOTEC  Take 20 mg by mouth 2 (two) times daily.     felodipine 10 MG 24 hr tablet  Commonly known as:  PLENDIL  Take 10 mg by mouth 2 (two) times daily.     Fluticasone-Salmeterol 250-50 MCG/DOSE Aepb  Commonly known as:  ADVAIR  Inhale 1 puff into the lungs every 12 (twelve) hours.     metoprolol tartrate 25 MG tablet  Commonly known as:  LOPRESSOR  Take 1 tablet (25 mg total) by mouth 2 (two) times daily.     mycophenolate 360 MG Tbec EC tablet  Commonly known as:  MYFORTIC  Take 360 mg by mouth 2 (two) times daily.     pravastatin 40 MG tablet  Commonly known as:  PRAVACHOL  Take 40 mg by mouth daily.     predniSONE 5 MG tablet  Commonly known as:  DELTASONE  Take 5 mg by mouth daily.     Rivaroxaban 15 MG Tabs tablet  Commonly known as:  XARELTO  Take 1 tablet (15 mg total) by mouth daily with supper.     spironolactone 25 MG tablet  Commonly known as:  ALDACTONE  Take 25 mg by mouth daily.     tacrolimus 1 MG capsule  Commonly known as:  PROGRAF  Take 2 mg by mouth 2 (two) times daily.     tiotropium 18 MCG inhalation capsule  Commonly known as:  SPIRIVA  Place 18 mcg into inhaler and inhale daily.           Total Time in preparing paper work, data evaluation and todays exam -  35 minutes  Auburn BilberryPATEL, Aryel Edelen M.D on 03/21/2015 at 2:55 PM  [facility]   Office  [phone]

## 2015-03-23 ENCOUNTER — Inpatient Hospital Stay
Admission: EM | Admit: 2015-03-23 | Discharge: 2015-03-29 | DRG: 242 | Disposition: A | Discharge status: Home / Self Care | Payer: Medicare Other | Attending: Internal Medicine | Admitting: Internal Medicine

## 2015-03-23 ENCOUNTER — Encounter: Payer: Self-pay | Admitting: *Deleted

## 2015-03-23 DIAGNOSIS — Z7951 Long term (current) use of inhaled steroids: Secondary | ICD-10-CM

## 2015-03-23 DIAGNOSIS — E1122 Type 2 diabetes mellitus with diabetic chronic kidney disease: Secondary | ICD-10-CM | POA: Diagnosis not present

## 2015-03-23 DIAGNOSIS — I2510 Atherosclerotic heart disease of native coronary artery without angina pectoris: Secondary | ICD-10-CM | POA: Diagnosis present

## 2015-03-23 DIAGNOSIS — R42 Dizziness and giddiness: Secondary | ICD-10-CM | POA: Diagnosis present

## 2015-03-23 DIAGNOSIS — I495 Sick sinus syndrome: Secondary | ICD-10-CM | POA: Diagnosis present

## 2015-03-23 DIAGNOSIS — Z950 Presence of cardiac pacemaker: Secondary | ICD-10-CM

## 2015-03-23 DIAGNOSIS — I4892 Unspecified atrial flutter: Secondary | ICD-10-CM | POA: Diagnosis not present

## 2015-03-23 DIAGNOSIS — M109 Gout, unspecified: Secondary | ICD-10-CM | POA: Diagnosis present

## 2015-03-23 DIAGNOSIS — J45909 Unspecified asthma, uncomplicated: Secondary | ICD-10-CM | POA: Diagnosis present

## 2015-03-23 DIAGNOSIS — N170 Acute kidney failure with tubular necrosis: Secondary | ICD-10-CM | POA: Diagnosis not present

## 2015-03-23 DIAGNOSIS — N189 Chronic kidney disease, unspecified: Secondary | ICD-10-CM | POA: Diagnosis not present

## 2015-03-23 DIAGNOSIS — Z940 Kidney transplant status: Secondary | ICD-10-CM | POA: Diagnosis not present

## 2015-03-23 DIAGNOSIS — J449 Chronic obstructive pulmonary disease, unspecified: Secondary | ICD-10-CM | POA: Diagnosis not present

## 2015-03-23 DIAGNOSIS — R55 Syncope and collapse: Secondary | ICD-10-CM

## 2015-03-23 DIAGNOSIS — I4891 Unspecified atrial fibrillation: Secondary | ICD-10-CM | POA: Diagnosis present

## 2015-03-23 DIAGNOSIS — E875 Hyperkalemia: Secondary | ICD-10-CM | POA: Diagnosis not present

## 2015-03-23 DIAGNOSIS — I739 Peripheral vascular disease, unspecified: Secondary | ICD-10-CM | POA: Diagnosis present

## 2015-03-23 DIAGNOSIS — Z833 Family history of diabetes mellitus: Secondary | ICD-10-CM | POA: Diagnosis not present

## 2015-03-23 DIAGNOSIS — N179 Acute kidney failure, unspecified: Secondary | ICD-10-CM

## 2015-03-23 DIAGNOSIS — I129 Hypertensive chronic kidney disease with stage 1 through stage 4 chronic kidney disease, or unspecified chronic kidney disease: Secondary | ICD-10-CM | POA: Diagnosis present

## 2015-03-23 DIAGNOSIS — Z90710 Acquired absence of both cervix and uterus: Secondary | ICD-10-CM | POA: Diagnosis not present

## 2015-03-23 DIAGNOSIS — I959 Hypotension, unspecified: Secondary | ICD-10-CM | POA: Diagnosis present

## 2015-03-23 DIAGNOSIS — R001 Bradycardia, unspecified: Secondary | ICD-10-CM | POA: Diagnosis present

## 2015-03-23 DIAGNOSIS — Z79899 Other long term (current) drug therapy: Secondary | ICD-10-CM | POA: Diagnosis not present

## 2015-03-23 LAB — CBC
HCT: 38.9 % (ref 35.0–47.0)
HEMOGLOBIN: 12.6 g/dL (ref 12.0–16.0)
MCH: 28.6 pg (ref 26.0–34.0)
MCHC: 32.3 g/dL (ref 32.0–36.0)
MCV: 88.3 fL (ref 80.0–100.0)
PLATELETS: 260 10*3/uL (ref 150–440)
RBC: 4.41 MIL/uL (ref 3.80–5.20)
RDW: 14.3 % (ref 11.5–14.5)
WBC: 9.4 10*3/uL (ref 3.6–11.0)

## 2015-03-23 LAB — BASIC METABOLIC PANEL
ANION GAP: 12 (ref 5–15)
BUN: 72 mg/dL — ABNORMAL HIGH (ref 6–20)
CALCIUM: 9 mg/dL (ref 8.9–10.3)
CO2: 19 mmol/L — ABNORMAL LOW (ref 22–32)
CREATININE: 3.98 mg/dL — AB (ref 0.44–1.00)
Chloride: 104 mmol/L (ref 101–111)
GFR calc Af Amer: 12 mL/min — ABNORMAL LOW (ref >60)
GFR, EST NON AFRICAN AMERICAN: 10 mL/min — AB (ref >60)
GLUCOSE: 271 mg/dL — AB (ref 65–99)
Potassium: 5.6 mmol/L — ABNORMAL HIGH (ref 3.5–5.1)
Sodium: 135 mmol/L (ref 135–145)

## 2015-03-23 LAB — TROPONIN I: TROPONIN I: 0.04 ng/mL — AB (ref <0.031)

## 2015-03-23 LAB — GLUCOSE, CAPILLARY: Glucose-Capillary: 251 mg/dL — ABNORMAL HIGH (ref 65–99)

## 2015-03-23 LAB — MRSA PCR SCREENING: MRSA BY PCR: NEGATIVE

## 2015-03-23 MED ORDER — SODIUM CHLORIDE 0.9 % IV SOLN
INTRAVENOUS | Status: DC
  Administered 2015-03-23 – 2015-03-24 (×2): via INTRAVENOUS

## 2015-03-23 MED ORDER — PREDNISONE 5 MG PO TABS
5.0000 mg | ORAL_TABLET | Freq: Every day | ORAL | Status: DC
  Administered 2015-03-24 – 2015-03-29 (×6): 5 mg via ORAL
  Filled 2015-03-23 (×6): qty 1

## 2015-03-23 MED ORDER — PROMETHAZINE HCL 25 MG/ML IJ SOLN
12.5000 mg | Freq: Four times a day (QID) | INTRAMUSCULAR | Status: DC | PRN
  Administered 2015-03-23: 12.5 mg via INTRAVENOUS

## 2015-03-23 MED ORDER — SODIUM CHLORIDE 0.9 % IJ SOLN
3.0000 mL | Freq: Two times a day (BID) | INTRAMUSCULAR | Status: DC
  Administered 2015-03-23 – 2015-03-28 (×10): 3 mL via INTRAVENOUS

## 2015-03-23 MED ORDER — PRAVASTATIN SODIUM 40 MG PO TABS
40.0000 mg | ORAL_TABLET | Freq: Every day | ORAL | Status: DC
  Administered 2015-03-23 – 2015-03-29 (×6): 40 mg via ORAL
  Filled 2015-03-23 (×3): qty 2
  Filled 2015-03-23: qty 1
  Filled 2015-03-23 (×4): qty 2

## 2015-03-23 MED ORDER — MOMETASONE FURO-FORMOTEROL FUM 100-5 MCG/ACT IN AERO
2.0000 | INHALATION_SPRAY | Freq: Two times a day (BID) | RESPIRATORY_TRACT | Status: DC
  Administered 2015-03-24 – 2015-03-29 (×11): 2 via RESPIRATORY_TRACT
  Filled 2015-03-23: qty 8.8

## 2015-03-23 MED ORDER — ONDANSETRON HCL 4 MG PO TABS: 4.0000 mg | ORAL_TABLET | Freq: Four times a day (QID) | ORAL | Status: DC | PRN

## 2015-03-23 MED ORDER — ACETAMINOPHEN 325 MG PO TABS: 650.0000 mg | ORAL_TABLET | Freq: Four times a day (QID) | ORAL | Status: DC | PRN

## 2015-03-23 MED ORDER — TIOTROPIUM BROMIDE MONOHYDRATE 18 MCG IN CAPS
18.0000 ug | ORAL_CAPSULE | Freq: Every day | RESPIRATORY_TRACT | Status: DC
  Administered 2015-03-23 – 2015-03-29 (×6): 18 ug via RESPIRATORY_TRACT
  Filled 2015-03-23 (×2): qty 5

## 2015-03-23 MED ORDER — MYCOPHENOLATE SODIUM 180 MG PO TBEC
360.0000 mg | DELAYED_RELEASE_TABLET | Freq: Two times a day (BID) | ORAL | Status: DC
  Administered 2015-03-23 – 2015-03-29 (×11): 360 mg via ORAL
  Filled 2015-03-23 (×12): qty 2

## 2015-03-23 MED ORDER — PROMETHAZINE HCL 25 MG/ML IJ SOLN
INTRAMUSCULAR | Status: AC
  Administered 2015-03-23: 12.5 mg via INTRAVENOUS
  Filled 2015-03-23: qty 1

## 2015-03-23 MED ORDER — ONDANSETRON HCL 4 MG/2ML IJ SOLN
4.0000 mg | Freq: Four times a day (QID) | INTRAMUSCULAR | Status: DC | PRN
  Administered 2015-03-23: 4 mg via INTRAVENOUS
  Filled 2015-03-23 (×2): qty 2

## 2015-03-23 MED ORDER — ACETAMINOPHEN 650 MG RE SUPP: 650.0000 mg | Freq: Four times a day (QID) | RECTAL | Status: DC | PRN

## 2015-03-23 MED ORDER — CALCIUM CARBONATE-VITAMIN D 500-200 MG-UNIT PO TABS
1.0000 | ORAL_TABLET | Freq: Every day | ORAL | Status: DC
  Administered 2015-03-23 – 2015-03-29 (×6): 1 via ORAL
  Filled 2015-03-23 (×7): qty 1

## 2015-03-23 MED ORDER — CLOPIDOGREL BISULFATE 75 MG PO TABS
75.0000 mg | ORAL_TABLET | Freq: Every day | ORAL | Status: DC
  Administered 2015-03-23: 75 mg via ORAL
  Filled 2015-03-23: qty 1

## 2015-03-23 MED ORDER — HEPARIN SODIUM (PORCINE) 5000 UNIT/ML IJ SOLN
5000.0000 [IU] | Freq: Three times a day (TID) | INTRAMUSCULAR | Status: DC
  Administered 2015-03-24 – 2015-03-29 (×15): 5000 [IU] via SUBCUTANEOUS
  Filled 2015-03-23 (×15): qty 1

## 2015-03-23 MED ORDER — TACROLIMUS 1 MG PO CAPS
2.0000 mg | ORAL_CAPSULE | Freq: Two times a day (BID) | ORAL | Status: DC
  Administered 2015-03-23 – 2015-03-29 (×11): 2 mg via ORAL
  Filled 2015-03-23 (×13): qty 2

## 2015-03-23 NOTE — ED Provider Notes (Addendum)
[facility] Emergency Department Provider Note  ____________________________________________  Time seen: 1745  I have reviewed the triage vital signs and the nursing notes.   HISTORY  Chief Complaint Bradycardia  bradycardia  Near-syncope    HPI Wendy Key is a 79 y.o. female who is recently the hospital with chest pain and cardiac dysrhythmia - atrial fibrillation versus flutter. She was begun on diltiazem and also prescribed metoprolol. Today, she is had episodes of near-syncope and was noted to have a low heart rate.   Patient arrived by EMS with noted heart rates in the 30s and 40s.  Patient is alert and communicative, but she reports she feels somewhat weak. She denies chest pain but does feel as though she might pass out on occasion.  He denies any shortness of breath.    Past Medical History  Diagnosis Date  . S/P kidney transplant 11/13/2011    [facility]  [facility] Follow[facility] by Dr. Cecil CrankerEmily Arnold  . Hypertension 11/13/2011  . Hyperlipidemia 11/13/2011  . Diabetes mellitus type II 11/13/2011  . CAD (coronary artery disease) 11/13/2011    [facility]  . Asthma 11/13/2011  . Peripheral vascular disease (HCC) 11/13/2011  . Gout 11/13/2011    Patient Active Problem List   Diagnosis Date Noted  . Atrial fibrillation with RVR (HCC) 03/17/2015  . Facial weakness 11/14/2011  . Hypertension 11/13/2011  . S/P kidney transplant 11/13/2011  . Hyperlipidemia 11/13/2011  . Diabetes mellitus type II 11/13/2011  . Asthma 11/13/2011  . Gout 11/13/2011  . CAD (coronary artery disease) 11/13/2011  . Peripheral vascular disease (HCC) 11/13/2011  . Syncope 11/13/2011  . Normocytic anemia 11/13/2011    Past Surgical History  Procedure Laterality Date  . Kidney transplant  2005    [facility]  . Av fistula placement      x2 with removal of left arm fistula  . Abdominal hysterectomy  1977    for fibroids     Current Outpatient Rx  Name  Route  Sig  Dispense  Refill  . Calcium Carbonate-Vitamin D (CALCIUM PLUS VITAMIN D PO)   Oral   Take 1 tablet by mouth daily.         . chlorthalidone (HYGROTON) 25 MG tablet   Oral   Take 25 mg by mouth daily.         . clopidogrel (PLAVIX) 75 MG tablet   Oral   Take 75 mg by mouth daily.         Marland Kitchen. diltiazem (CARDIZEM LA) 240 MG 24 hr tablet   Oral   Take 1 tablet (240 mg total) by mouth daily.   30 tablet   0   . enalapril (VASOTEC) 20 MG tablet   Oral   Take 20 mg by mouth 2 (two) times daily.         . felodipine (PLENDIL) 10 MG 24 hr tablet   Oral   Take 10 mg by mouth 2 (two) times daily.         . Fluticasone-Salmeterol (ADVAIR) 250-50 MCG/DOSE AEPB   Inhalation   Inhale 1 puff into the lungs every 12 (twelve) hours.         . metoprolol tartrate (LOPRESSOR) 25 MG tablet   Oral   Take 1 tablet (25 mg total) by mouth 2 (two) times daily.   60 tablet   0   . mycophenolate (MYFORTIC) 360 MG TBEC  Oral   Take 360 mg by mouth 2 (two) times daily.         . pravastatin (PRAVACHOL) 40 MG tablet   Oral   Take 40 mg by mouth daily.         . predniSONE (DELTASONE) 5 MG tablet   Oral   Take 5 mg by mouth daily.         . Rivaroxaban (XARELTO) 15 MG TABS tablet   Oral   Take 1 tablet (15 mg total) by mouth daily with supper.   30 tablet   30   . spironolactone (ALDACTONE) 25 MG tablet   Oral   Take 25 mg by mouth daily.         . tacrolimus (PROGRAF) 1 MG capsule   Oral   Take 2 mg by mouth 2 (two) times daily.         Marland Kitchen tiotropium (SPIRIVA) 18 MCG inhalation capsule   Inhalation   Place 18 mcg into inhaler and inhale daily.           Allergies Review of patient's allergies indicates no known allergies.  Family History  Problem Relation Age of Onset  . Diabetes Father   . Diabetes type II Son     Social History Social History  Substance Use Topics  . Smoking status: Never Smoker    . Smokeless tobacco: Never Used  . Alcohol Use: No    Review of Systems  Constitutional: Fatigue, near-syncope ENT: Negative for congestion. Cardiovascular: Bradycardia with recent cardiac evaluation for atrial fibrillation/flutter. Respiratory: Negative for cough. Gastrointestinal: Negative for abdominal pain, vomiting and diarrhea. Genitourinary: Negative for dysuria. Musculoskeletal: No myalgias or injuries. Skin: Negative for rash. Neurological: Negative for headache or focal weakness   10-point ROS otherwise negative.  ____________________________________________   PHYSICAL EXAM:  VITAL SIGNS: ED Triage Vitals  Enc Vitals Group     BP --      Pulse --      Resp --      Temp --      Temp src --      SpO2 --      Weight --      Height --      Head Cir --      Peak Flow --      Pain Score --      Pain Loc --      Pain Edu? --      Excl. in GC? --     Constitutional: Alert and oriented.Appears a little bit weak, but communicative and in no acute distress.  ENT   Head: Normocephalic and atraumatic.   Nose: No congestion/rhinnorhea.       Mouth: No erythema, no swelling   Cardiovascular: Notable bradycardia at 37 - 42 Respiratory:  Normal respiratory effort, no tachypnea.    Breath sounds are clear and equal bilaterally.  Gastrointestinal: Soft and nontender. No distention.  Back: No muscle spasm, no tenderness, no CVA tenderness. Musculoskeletal: No deformity noted. Nontender with normal range of motion in all extremities.  No noted edema. Neurologic:  Communicative. Normal appearing spontaneous movement in all 4 extremities. No gross focal neurologic deficits are appreciated.  Skin:  Skin is warm, dry. No rash noted. Psychiatric: Mood and affect are normal. Speech and behavior are normal.  ____________________________________________    LABS (pertinent positives/negatives)  Labs Reviewed  BASIC METABOLIC PANEL - Abnormal; Notable for the  following:    Potassium 5.6 (*)  CO2 19 (*)    Glucose, Bld 271 (*)    BUN 72 (*)    Creatinine, Ser 3.98 (*)    GFR calc non Af Amer 10 (*)    GFR calc Af Amer 12 (*)    All other components within normal limits  TROPONIN I - Abnormal; Notable for the following:    Troponin I 0.04 (*)    All other components within normal limits  CBC     ____________________________________________   EKG  ED ECG REPORT I, Anabelen Kaminsky W, the attending physician, personally viewed and interpreted this ECG.   Date: 03/23/2015  EKG Time: 1854  Rate: 39  Rhythm: Atrial flutter with junctional bradycardia  Axis: Normal  Intervals: Normal  ST&T Change: None noted   ____________________________________________   ____________________________________________   PROCEDURES   ____________________________________________   INITIAL IMPRESSION / ASSESSMENT AND PLAN / ED COURSE  Pertinent labs & imaging results that were available during my care of the patient were reviewed by me and considered in my medical decision making (see chart for details).  79 year old female with recent hospital admission for atrial fibrillation/flutter. Apparently, the patient and her cardiologist discussed a pacemaker but chose not to implant 1. The patient was put on diltiazem. She is also prescribed metoprolol 25 mg. It is unclear to me if the metoprolol dose was supposed to be in addition to her previous prescription of 100 mg or if she was supposed to stop the 100 mg of metoprolol and only take the diltiazem and 25 mg of metoprolol.  At this time, she continues to be notably bradycardic with episodes of near-syncope today. She is not having any active chest pain. We will check basic cardiac labs, keep her on a monitor, and, if there is no improvement, admitted to the hospital for ongoing monitoring of this rated cardia and allow for her medications to wear off. At this time, I do not see any indication for  glucagon, calcium, atropine, or other interventions.  ----------------------------------------- 7:00 PM on 03/23/2015 -----------------------------------------  EKG obtained. Atrial flutter with junctional bradycardia at 39 area patient's heart rate has been consistent. Her blood pressure is stable. I spoke with Dr. Darrold Junker, on-call for cardiology. He agrees with my plan not to intervene pharmaceutically and to admit to the hospital for ongoing monitoring of her bradycardia.  Her potassium is 5.6. Her renal function tests have bumped to BUN of 72 and creatinine of 3.98. Troponin is 0.04.  I spoke with Dr. Quentin Cornwall for admission to the hospital.  ____________________________________________   FINAL CLINICAL IMPRESSION(S) / ED DIAGNOSES  Final diagnoses:  Atrial flutter, unspecified type (HCC)  Symptomatic bradycardia  Acute renal failure, unspecified acute renal failure type [facility])  Near syncope     Darien Ramus, MD 03/23/15 1916

## 2015-03-23 NOTE — ED Notes (Signed)
Pt arrived via EMS from home reporting onset of lightheadedness and "drop in BP" beginning yesterday. Pt was seen in hospital and discharged Thursday after talk of placing a pacemaker. Pt was placed on Diltiazem and xerelto beginning yesterday. Pt arrived with a HR of 38. Pt denies syncope and is alert and oriented.

## 2015-03-23 NOTE — H&P (Signed)
[facility] - [place] at [facility]   PATIENT NAME: Wendy Key    MR#:  [id number]  DATE OF BIRTH:  July 05, 1935  DATE OF ADMISSION:  03/23/2015  PRIMARY CARE PHYSICIAN: Pcp Not In System   REQUESTING/REFERRING PHYSICIAN: Dr. Janalyn Harder  CHIEF COMPLAINT:   Chief Complaint  Patient presents with  . Bradycardia    HISTORY OF PRESENT ILLNESS:  Wendy Key  is a 79 y.o. female with a known history of atrial flutter, hypertension, hyperlipidemia, type 2 diabetes, history of renal transplant, peripheral vascular disease, gout who presents to the hospital due to dizziness, weakness and presyncopal symptoms and noted to be significantly bradycardic. Patient was just recently admitted to the hospital and discharged 2 days ago and was noted to have new onset atrial flutter. Patient on the hospitalist on a Cardizem drip and then weaned off the drip and she started developed pauses. She was discharged on long-acting Cardizem and oral metoprolol for rate control and also on Xarelto. She has been taking her medications but for the past day or so has been to feeling weak lethargic and dizzy.  Her daughter noticed that she was hypotensive late last night and give her some fluids and food and she felt better. This afternoon she was feeling now much better and therefore she called EMS and she was brought to the emergency room. In the ER patient was noted to be hypotensive with systolic blood pressures in 80s and also noted to be significantly bradycardic with heart rates in the mid to high 30s. Hospitalist Services were contacted further treatment and evaluation.  PAST MEDICAL HISTORY:   Past Medical History  Diagnosis Date  . S/P kidney transplant 11/13/2011    cadevaric transplant  2005 at [facility] Followed by Dr. Cecil Cranker  . Hypertension 11/13/2011  . Hyperlipidemia 11/13/2011  . Diabetes mellitus type II 11/13/2011  . CAD (coronary artery disease) 11/13/2011     50% LAD lesion diagnosed at [facility]  . Asthma 11/13/2011  . Peripheral vascular disease (HCC) 11/13/2011  . Gout 11/13/2011    PAST SURGICAL HISTORY:   Past Surgical History  Procedure Laterality Date  . Kidney transplant  2005    At [facility]  . Av fistula placement      x2 with removal of left arm fistula  . Abdominal hysterectomy  1977    for fibroids    SOCIAL HISTORY:   Social History  Substance Use Topics  . Smoking status: Never Smoker   . Smokeless tobacco: Never Used  . Alcohol Use: No    FAMILY HISTORY:   Family History  Problem Relation Age of Onset  . Diabetes Father   . Diabetes type II Son     DRUG ALLERGIES:  No Known Allergies  REVIEW OF SYSTEMS:   Review of Systems  Constitutional: Negative for fever and weight loss.  HENT: Negative for congestion, nosebleeds and tinnitus.   Eyes: Negative for blurred vision, double vision and redness.  Respiratory: Negative for cough, hemoptysis and shortness of breath.   Cardiovascular: Negative for chest pain, orthopnea, leg swelling and PND.  Gastrointestinal: Negative for nausea, vomiting, abdominal pain, diarrhea and melena.  Genitourinary: Negative for dysuria, urgency and hematuria.  Musculoskeletal: Negative for joint pain and falls.  Neurological: Positive for weakness. Negative for dizziness, tingling, sensory change, focal weakness, seizures and headaches.  Endo/Heme/Allergies: Negative for polydipsia. Does not bruise/bleed easily.  Psychiatric/Behavioral: Negative for depression and memory loss. The patient is  not nervous/anxious.     MEDICATIONS AT HOME:   Prior to Admission medications   Medication Sig Start Date End Date Taking? Authorizing Provider  Calcium Carbonate-Vitamin D (CALCIUM PLUS VITAMIN D PO) Take 1 tablet by mouth daily.   Yes Historical Provider, MD  chlorthalidone (HYGROTON) 25 MG tablet Take 25 mg by mouth daily.   Yes Historical Provider, MD  cloNIDine (CATAPRES) 0.1 MG  tablet Take 0.1 mg by mouth 3 (three) times daily.   Yes Historical Provider, MD  clopidogrel (PLAVIX) 75 MG tablet Take 75 mg by mouth daily.   Yes Historical Provider, MD  diltiazem (CARDIZEM LA) 240 MG 24 hr tablet Take 1 tablet (240 mg total) by mouth daily. 03/21/15  Yes Auburn Bilberry, MD  enalapril (VASOTEC) 20 MG tablet Take 20 mg by mouth 2 (two) times daily.   Yes Historical Provider, MD  felodipine (PLENDIL) 10 MG 24 hr tablet Take 10 mg by mouth 2 (two) times daily.   Yes Historical Provider, MD  Fluticasone-Salmeterol (ADVAIR) 250-50 MCG/DOSE AEPB Inhale 1 puff into the lungs every 12 (twelve) hours.   Yes Historical Provider, MD  metoprolol (LOPRESSOR) 100 MG tablet Take 100 mg by mouth 2 (two) times daily.   Yes Historical Provider, MD  metoprolol tartrate (LOPRESSOR) 25 MG tablet Take 1 tablet (25 mg total) by mouth 2 (two) times daily. 03/21/15  Yes Auburn Bilberry, MD  mycophenolate (MYFORTIC) 360 MG TBEC Take 360 mg by mouth 2 (two) times daily.   Yes Historical Provider, MD  pravastatin (PRAVACHOL) 40 MG tablet Take 40 mg by mouth daily.   Yes Historical Provider, MD  predniSONE (DELTASONE) 5 MG tablet Take 5 mg by mouth daily.   Yes Historical Provider, MD  Rivaroxaban (XARELTO) 15 MG TABS tablet Take 1 tablet (15 mg total) by mouth daily with supper. 03/21/15  Yes Auburn Bilberry, MD  spironolactone (ALDACTONE) 25 MG tablet Take 25 mg by mouth daily.   Yes Historical Provider, MD  tacrolimus (PROGRAF) 1 MG capsule Take 2 mg by mouth 2 (two) times daily.   Yes Historical Provider, MD  tiotropium (SPIRIVA) 18 MCG inhalation capsule Place 18 mcg into inhaler and inhale daily.   Yes Historical Provider, MD      VITAL SIGNS:  Blood pressure 90/39, pulse 39, resp. rate 17, SpO2 97 %.  PHYSICAL EXAMINATION:  Physical Exam  GENERAL:  79 y.o.-year-old patient lying in the bed with no acute distress.  EYES: Pupils equal, round, reactive to light and accommodation. No scleral icterus.  Extraocular muscles intact.  HEENT: Head atraumatic, normocephalic. Oropharynx and nasopharynx clear. No oropharyngeal erythema, moist oral mucosa  NECK:  Supple, no jugular venous distention. No thyroid enlargement, no tenderness.  LUNGS: Normal breath sounds bilaterally, no wheezing, rales, rhonchi. No use of accessory muscles of respiration.  CARDIOVASCULAR: S1, S2 Bradycardic. No murmurs, rubs, gallops, clicks.  ABDOMEN: Soft, nontender, nondistended. Bowel sounds present. No organomegaly or mass.  EXTREMITIES: No pedal edema, cyanosis, or clubbing. + 2 pedal & radial pulses b/l.   NEUROLOGIC: Cranial nerves II through XII are intact. No focal Motor or sensory deficits appreciated b/l PSYCHIATRIC: The patient is alert and oriented x 3. Good affect.  SKIN: No obvious rash, lesion, or ulcer.   LABORATORY PANEL:   CBC  Recent Labs Lab 03/23/15 1833  WBC 9.4  HGB 12.6  HCT 38.9  PLT 260   ------------------------------------------------------------------------------------------------------------------  Chemistries   Recent Labs Lab 03/18/15 0255  03/23/15 1833  NA 142  --  135  K 4.4  --  5.6*  CL 114*  --  104  CO2 24  --  19*  GLUCOSE 139*  --  271*  BUN 31*  --  72*  CREATININE 1.41*  < > 3.98*  CALCIUM 9.2  --  9.0  MG 1.7  --   --   < > = values in this interval not displayed. ------------------------------------------------------------------------------------------------------------------  Cardiac Enzymes  Recent Labs Lab 03/23/15 1833  TROPONINI 0.04*   ------------------------------------------------------------------------------------------------------------------  RADIOLOGY:  No results found.   IMPRESSION AND PLAN:   79 year old female with past medical history of renal transplant, diabetes, hypertension, history of gout, atrial flutter, who presented to the hospital due to weakness, dizziness and presyncopal symptoms and noted to be significantly  bradycardic.  #1 symptomatic bradycardia-patient's heart rate is significantly low in the mid to high 30s. She is symptomatic given her dizziness and weakness. She is although hemodynamically stable. -I will hold her metoprolol, Cardizem. Keep her on stepdown level of care. -I will get a cardiology consult. Impression patient may benefit from a pacemaker given the fact that she had pauses during her previous hospitalization.  #2 history of atrial flutter-she seems to be in atrial flutter presently but her heart rates is significantly low. I will hold her rate controlling meds. -She was started on Xarelto which I will discontinue given her mild acute on chronic renal failure. This can be further addressed prior to discharge.  #3 acute on chronic renal failure-this is likely ATN secondary to underlying hypotension and bradycardia. -I will follow BUN and creatinine and urine output.  #4 hyperkalemia-this is likely secondary to the acute on chronic renal failure. -I will repeat a potassium level in the morning.  #5 history of renal transplant-continue mycophenolate, prednisone, Prograf.  #6 history of COPD-no acute exacerbation. Continue Spiriva, Dulera.  #7 hypertension-I will hold patient's antihypertensives given patient's relative hypotension presently.  All the records are reviewed and case discussed with ED provider. Management plans discussed with the patient, family and they are in agreement.  CODE STATUS: Full  TOTAL TIME TAKING CARE OF THIS PATIENT: 50 minutes.    Houston SirenSAINANI,Latresha Yahr J M.D on 03/23/2015 at 8:21 PM  Between 7am to 6pm - Pager - [phone]  After 6pm go to [email or web address] - password [email or web address]  [facility]  Office  [phone]  CC: Primary care physician; Pcp Not In System

## 2015-03-23 NOTE — Progress Notes (Signed)
eLink Physician-Brief Progress Note Patient Name: Wendy Key DOB: 27-Oct-1935 MRN: [id number]   Date of Service  03/23/2015  HPI/Events of Note  Admit with h/o afib with RVR now bradycardia with aflutter and high degree av block on BB and CHB as out pt but hemodynamics ok  eICU Interventions  Consider glucagon prn if symptomatic but no other ICU interventions needed at this point - will continue to monitor at elink     Intervention Category Major Interventions: Arrhythmia - evaluation and management  Sandrea HughsMichael Mikeria Valin 03/23/2015, 10:40 PM

## 2015-03-23 NOTE — ED Notes (Addendum)
Pt began vomiting and reports stomach pain at this time. Pt told to give nausea medication time to kick in and then MD would be notified if nausea continued.

## 2015-03-23 NOTE — ED Notes (Signed)
PT reports nausea has continued and reports need for more nausea medication floor nurse reports it will be okay to bring pt upstairs and page MD in CCU. MD is not in ED at this time. Patient made aware.

## 2015-03-24 LAB — BASIC METABOLIC PANEL
ANION GAP: 12 (ref 5–15)
BUN: 78 mg/dL — ABNORMAL HIGH (ref 6–20)
CALCIUM: 8.5 mg/dL — AB (ref 8.9–10.3)
CO2: 17 mmol/L — ABNORMAL LOW (ref 22–32)
Chloride: 107 mmol/L (ref 101–111)
Creatinine, Ser: 3.72 mg/dL — ABNORMAL HIGH (ref 0.44–1.00)
GFR calc Af Amer: 13 mL/min — ABNORMAL LOW (ref >60)
GFR, EST NON AFRICAN AMERICAN: 11 mL/min — AB (ref >60)
GLUCOSE: 124 mg/dL — AB (ref 65–99)
Potassium: 5.5 mmol/L — ABNORMAL HIGH (ref 3.5–5.1)
Sodium: 136 mmol/L (ref 135–145)

## 2015-03-24 LAB — GLUCOSE, CAPILLARY
GLUCOSE-CAPILLARY: 101 mg/dL — AB (ref 65–99)
GLUCOSE-CAPILLARY: 225 mg/dL — AB (ref 65–99)
GLUCOSE-CAPILLARY: 191 mg/dL — AB (ref 65–99)
Glucose-Capillary: 120 mg/dL — ABNORMAL HIGH (ref 65–99)
Glucose-Capillary: 181 mg/dL — ABNORMAL HIGH (ref 65–99)
Glucose-Capillary: 188 mg/dL — ABNORMAL HIGH (ref 65–99)

## 2015-03-24 LAB — CBC
HCT: 34.9 % — ABNORMAL LOW (ref 35.0–47.0)
HEMOGLOBIN: 11.2 g/dL — AB (ref 12.0–16.0)
MCH: 27.6 pg (ref 26.0–34.0)
MCHC: 32 g/dL (ref 32.0–36.0)
MCV: 86 fL (ref 80.0–100.0)
PLATELETS: 244 10*3/uL (ref 150–440)
RBC: 4.06 MIL/uL (ref 3.80–5.20)
RDW: 14 % (ref 11.5–14.5)
WBC: 9.8 10*3/uL (ref 3.6–11.0)

## 2015-03-24 MED ORDER — METOPROLOL TARTRATE 1 MG/ML IV SOLN
2.5000 mg | INTRAVENOUS | Status: DC | PRN
  Administered 2015-03-24 – 2015-03-28 (×7): 2.5 mg via INTRAVENOUS
  Filled 2015-03-24 (×7): qty 5

## 2015-03-24 MED ORDER — METOPROLOL TARTRATE 1 MG/ML IV SOLN
INTRAVENOUS | Status: AC
  Filled 2015-03-24: qty 5

## 2015-03-24 MED ORDER — SODIUM CHLORIDE 0.9 % IV BOLUS (SEPSIS)
1000.0000 mL | Freq: Once | INTRAVENOUS | Status: AC
  Administered 2015-03-24: 1000 mL via INTRAVENOUS

## 2015-03-24 MED ORDER — INFLUENZA VAC SPLIT QUAD 0.5 ML IM SUSY
0.5000 mL | PREFILLED_SYRINGE | INTRAMUSCULAR | Status: AC
  Administered 2015-03-25: 0.5 mL via INTRAMUSCULAR
  Filled 2015-03-24: qty 0.5

## 2015-03-24 MED ORDER — SODIUM POLYSTYRENE SULFONATE 15 GM/60ML PO SUSP
15.0000 g | Freq: Once | ORAL | Status: AC
  Administered 2015-03-24: 15 g via ORAL
  Filled 2015-03-24: qty 60

## 2015-03-24 MED ORDER — INSULIN ASPART 100 UNIT/ML ~~LOC~~ SOLN: 0.0000 [IU] | Freq: Every day | SUBCUTANEOUS | Status: DC

## 2015-03-24 MED ORDER — INSULIN ASPART 100 UNIT/ML ~~LOC~~ SOLN
0.0000 [IU] | Freq: Three times a day (TID) | SUBCUTANEOUS | Status: DC
  Administered 2015-03-24: 2 [IU] via SUBCUTANEOUS
  Administered 2015-03-24: 3 [IU] via SUBCUTANEOUS
  Administered 2015-03-25: 1 [IU] via SUBCUTANEOUS
  Administered 2015-03-25 – 2015-03-26 (×3): 2 [IU] via SUBCUTANEOUS
  Administered 2015-03-28: 1 [IU] via SUBCUTANEOUS
  Administered 2015-03-28: 2 [IU] via SUBCUTANEOUS
  Administered 2015-03-28: 1 [IU] via SUBCUTANEOUS
  Administered 2015-03-29: 2 [IU] via SUBCUTANEOUS
  Filled 2015-03-24 (×2): qty 1
  Filled 2015-03-24 (×3): qty 2
  Filled 2015-03-24: qty 1
  Filled 2015-03-24: qty 3
  Filled 2015-03-24 (×3): qty 2

## 2015-03-24 NOTE — Care Management Note (Signed)
Case Management Note  Patient Details  Name: Leanna Satoearlie B Dauphinais MRN: [id number] Date of Birth: 12/30/1935  Subjective/Objective:    [facility] with Afib/RVR. Discharged on Eliquis and with a walker. No home health services. Obtains her prescriptions at [facility]. Lives with her daughter. Good family support. Readmitted with symptomatic bradycardia.                 Action/Plan: Will monitor progression.   Expected Discharge Date:                  Expected Discharge Plan:  Home/Self Care  In-House Referral:     Discharge planning Services  CM Consult  Post Acute Care Choice:    Choice offered to:     DME Arranged:    DME Agency:     HH Arranged:    HH Agency:     Status of Service:  In process, will continue to follow  Medicare Important Message Given:    Date Medicare IM Given:    Medicare IM give by:    Date Additional Medicare IM Given:    Additional Medicare Important Message give by:     If discussed at Long Length of Stay Meetings, dates discussed:    Additional Comments:  Marily MemosLisa M Timaya Bojarski, RN 03/24/2015, 8:44 AM

## 2015-03-24 NOTE — Consult Note (Signed)
[facility]  CARDIOLOGY CONSULT NOTE  Patient ID: Wendy Key MRN: [id number] DOB/AGE: 01-18-1936 79 y.o.  Admit date: 03/23/2015 Referring Physician [facility] Primary Physician  Primary Cardiologist Fath Reason for Consultation bradycardia  HPI: 79 year old female referred for evaluation of bradycardia.the patient was recently hospitalized 03/17/15 for atrial fibrillation/atrial flutter, discharged home on Cardizem, Toprol, Xarelto and Plavix.The patient was returned to [facility] emergency room with generalized weakness and dizziness.patient was found to be in atrial flutter with slow ventricular response, rates in the upper 30s and 40s. Patient was hypotensive. She was admitted to the ICU where she has remained hemodynamically stable. Today, heart rates are in the 50 to 60 beats per minute and blood pressure is normal. The patient is currently on Xarelto and Plavix.  Review of systems complete and found to be negative unless listed above     Past Medical History  Diagnosis Date  . S/P kidney transplant 11/13/2011    cadevaric transplant  2005 at [facility] Followed by Dr. Cecil Cranker  . Hypertension 11/13/2011  . Hyperlipidemia 11/13/2011  . Diabetes mellitus type II 11/13/2011  . CAD (coronary artery disease) 11/13/2011    50% LAD lesion diagnosed at [facility]  . Asthma 11/13/2011  . Peripheral vascular disease (HCC) 11/13/2011  . Gout 11/13/2011    Past Surgical History  Procedure Laterality Date  . Kidney transplant  2005    At [facility]  . Av fistula placement      x2 with removal of left arm fistula  . Abdominal hysterectomy  1977    for fibroids    Prescriptions prior to admission  Medication Sig Dispense Refill Last Dose  . Calcium Carbonate-Vitamin D (CALCIUM PLUS VITAMIN D PO) Take 1 tablet by mouth daily.   03/22/2015 at Unknown time  . chlorthalidone (HYGROTON) 25 MG tablet Take 25 mg by mouth daily.   03/22/2015 at Unknown time  . cloNIDine (CATAPRES) 0.1 MG tablet Take  0.1 mg by mouth 3 (three) times daily.   03/23/2015 at Unknown time  . clopidogrel (PLAVIX) 75 MG tablet Take 75 mg by mouth daily.   03/22/2015 at Unknown time  . diltiazem (CARDIZEM LA) 240 MG 24 hr tablet Take 1 tablet (240 mg total) by mouth daily. 30 tablet 0 03/23/2015 at 0830  . enalapril (VASOTEC) 20 MG tablet Take 20 mg by mouth 2 (two) times daily.   03/22/2015 at Unknown time  . felodipine (PLENDIL) 10 MG 24 hr tablet Take 10 mg by mouth 2 (two) times daily.   03/23/2015 at Unknown time  . Fluticasone-Salmeterol (ADVAIR) 250-50 MCG/DOSE AEPB Inhale 1 puff into the lungs every 12 (twelve) hours.   03/22/2015 at Unknown time  . metoprolol (LOPRESSOR) 100 MG tablet Take 100 mg by mouth 2 (two) times daily.   03/23/2015 at 0830  . metoprolol tartrate (LOPRESSOR) 25 MG tablet Take 1 tablet (25 mg total) by mouth 2 (two) times daily. 60 tablet 0 03/23/2015 at 0830  . mycophenolate (MYFORTIC) 360 MG TBEC Take 360 mg by mouth 2 (two) times daily.   03/23/2015 at Unknown time  . pravastatin (PRAVACHOL) 40 MG tablet Take 40 mg by mouth daily.   03/22/2015 at Unknown time  . predniSONE (DELTASONE) 5 MG tablet Take 5 mg by mouth daily.   03/23/2015 at Unknown time  . Rivaroxaban (XARELTO) 15 MG TABS tablet Take 1 tablet (15 mg total) by mouth daily with supper. 30 tablet 30 03/23/2015 at 1300  . spironolactone (ALDACTONE) 25  MG tablet Take 25 mg by mouth daily.   03/22/2015 at Unknown time  . tacrolimus (PROGRAF) 1 MG capsule Take 2 mg by mouth 2 (two) times daily.   03/23/2015 at Unknown time  . tiotropium (SPIRIVA) 18 MCG inhalation capsule Place 18 mcg into inhaler and inhale daily.   03/22/2015 at Unknown time   Social History   Social History  . Marital Status: Widowed    Spouse Name: N/A  . Number of Children: N/A  . Years of Education: N/A   Occupational History  . Not on file.   Social History Main Topics  . Smoking status: Never Smoker   . Smokeless tobacco: Never Used  . Alcohol Use:  No  . Drug Use: No  . Sexual Activity: Not Currently   Other Topics Concern  . Not on file   Social History Narrative   Patient lives in [place] with her Daughter.  PCP is Dr. Ardis Rowan in [place].     Family History  Problem Relation Age of Onset  . Diabetes Father   . Diabetes type II Son       Review of systems complete and found to be negative unless listed above      PHYSICAL EXAM  General: Well developed, well nourished, in no acute distress HEENT:  Normocephalic and atramatic Neck:  No JVD.  Lungs: Clear bilaterally to auscultation and percussion. Heart: bradycardia. Normal S1 and S2 without gallops or murmurs.  Abdomen: Bowel sounds are positive, abdomen soft and non-tender  Msk:  Back normal, normal gait. Normal strength and tone for age. Extremities: No clubbing, cyanosis or edema.   Neuro: Alert and oriented X 3. Psych:  Good affect, responds appropriately  Labs:   Lab Results  Component Value Date   WBC 9.8 03/24/2015   HGB 11.2* 03/24/2015   HCT 34.9* 03/24/2015   MCV 86.0 03/24/2015   PLT 244 03/24/2015    Recent Labs Lab 03/24/15 0433  NA 136  K 5.5*  CL 107  CO2 17*  BUN 78*  CREATININE 3.72*  CALCIUM 8.5*  GLUCOSE 124*   Lab Results  Component Value Date   CKTOTAL 67 11/14/2011   CKMB 2.2 11/14/2011   TROPONINI 0.04* 03/23/2015    Lab Results  Component Value Date   CHOL 182 04/20/2013   CHOL 164 11/14/2011   CHOL 176 09/06/2011   Lab Results  Component Value Date   HDL 56 04/20/2013   HDL 43 11/14/2011   HDL 52 09/06/2011   Lab Results  Component Value Date   LDLCALC 112* 04/20/2013   LDLCALC 100* 11/14/2011   Lab Results  Component Value Date   TRIG 69 04/20/2013   TRIG 103 11/14/2011   Lab Results  Component Value Date   CHOLHDL 3.8 11/14/2011   No results found for: LDLDIRECT    Radiology: Dg Chest 2 View  03/17/2015  CLINICAL DATA:  Chest pain, palpitations, and shortness of breath. EXAM: CHEST  2  VIEW COMPARISON:  11/13/2011 FINDINGS: Central interstitial pattern suggesting bronchitic changes. Normal heart size and pulmonary vascularity. No focal airspace disease or consolidation in the lungs. No blunting of costophrenic angles. No pneumothorax. Mediastinal contours appear intact. Calcified and tortuous aorta. Probable Coronary artery calcifications. Degenerative changes in the spine. IMPRESSION: No evidence of active pulmonary disease. Central interstitial changes suggesting bronchitic change. Electronically Signed   By: Burman Nieves M.D.   On: 03/17/2015 03:28    EKG: atrial flutter at 60 bpm  ASSESSMENT AND PLAN:   1. Symptomatic bradycardia, currently clinically stable,on both Plavix and Xarelto 2. Chronic atrial flutter  Recommendations  1. Hold Xarelto for 2-3 days 2. Hold Plavix for 5 days 3. Proceed with permanent pacemaker implantation after holding Xarelto and Plavix  Signed: Tyresse Jayson MD,PhD, [facility] 03/24/2015, 7:36 AM

## 2015-03-24 NOTE — Progress Notes (Signed)
eLink Physician-Brief Progress Note Patient Name: Wendy Key DOB: [birth year] MRN: [id number]   Date of Service  03/24/2015  HPI/Events of Note  Glu 250, dm Diet   eICU Interventions  ssi     Intervention Category Intermediate Interventions: Hyperglycemia - evaluation and treatment  FEINSTEIN,DANIEL J. 03/24/2015, 12:37 AM

## 2015-03-24 NOTE — Progress Notes (Signed)
eLink Physician-Brief Progress Note Patient Name: Wendy Key DOB: 06-14-1935 MRN: [id number]   Date of Service  03/24/2015  HPI/Events of Note  afib with RVR Chart reviewed Initially admitted with symptomatic bradycardia, acute on chronic renal failure Home b-blocker and calcium channel blocker held  eICU Interventions  Order metoprolol IV prn HR > 110     Intervention Category Major Interventions: Arrhythmia - evaluation and management  Max FickleDouglas Raena Pau 03/24/2015, 6:58 PM

## 2015-03-24 NOTE — Progress Notes (Signed)
[facility] to address blood sugar of 251 with no insulin ordered and low blood pressure.

## 2015-03-24 NOTE — Progress Notes (Signed)
[facility] - [place] at [facility]   PATIENT NAME: Wendy Key    MR#:  [id number]  DATE OF BIRTH:  [birth year]  SUBJECTIVE:  CHIEF COMPLAINT:  Patient is resting comfortably. Denies any dizziness, chest pain or shortness of breath  REVIEW OF SYSTEMS:  CONSTITUTIONAL: No fever, fatigue or weakness.  EYES: No blurred or double vision.  EARS, NOSE, AND THROAT: No tinnitus or ear pain.  RESPIRATORY: No cough, shortness of breath, wheezing or hemoptysis.  CARDIOVASCULAR: No chest pain, orthopnea, edema.  GASTROINTESTINAL: No nausea, vomiting, diarrhea or abdominal pain.  GENITOURINARY: No dysuria, hematuria.  ENDOCRINE: No polyuria, nocturia,  HEMATOLOGY: No anemia, easy bruising or bleeding SKIN: No rash or lesion. MUSCULOSKELETAL: No joint pain or arthritis.   NEUROLOGIC: No tingling, numbness, weakness.  PSYCHIATRY: No anxiety or depression.   DRUG ALLERGIES:  No Known Allergies  VITALS:  Blood pressure 135/94, pulse 105, temperature 98.8 F (37.1 C), temperature source Oral, resp. rate 21, height  (1.676 m), weight 69.6 kg (153 lb 7 oz), SpO2 97 %.  PHYSICAL EXAMINATION:  GENERAL:  79 y.o.-year-old patient lying in the bed with no acute distress.  EYES: Pupils equal, round, reactive to light and accommodation. No scleral icterus. Extraocular muscles intact.  HEENT: Head atraumatic, normocephalic. Oropharynx and nasopharynx clear.  NECK:  Supple, no jugular venous distention. No thyroid enlargement, no tenderness.  LUNGS: Normal breath sounds bilaterally, no wheezing, rales,rhonchi or crepitation. No use of accessory muscles of respiration.  CARDIOVASCULAR: S1, S2 normal. No murmurs, rubs, or gallops.  ABDOMEN: Soft, nontender, nondistended. Bowel sounds present. No organomegaly or mass.  EXTREMITIES: No pedal edema, cyanosis, or clubbing.  NEUROLOGIC: Cranial nerves II through XII are intact. Muscle strength 5/5 in all extremities. Sensation  intact. Gait not checked.  PSYCHIATRIC: The patient is alert and oriented x 3.  SKIN: No obvious rash, lesion, or ulcer.    LABORATORY PANEL:   CBC  Recent Labs Lab 03/24/15 0433  WBC 9.8  HGB 11.2*  HCT 34.9*  PLT 244   ------------------------------------------------------------------------------------------------------------------  Chemistries   Recent Labs Lab 03/18/15 0255  03/24/15 0433  NA 142  < > 136  K 4.4  < > 5.5*  CL 114*  < > 107  CO2 24  < > 17*  GLUCOSE 139*  < > 124*  BUN 31*  < > 78*  CREATININE 1.41*  < > 3.72*  CALCIUM 9.2  < > 8.5*  MG 1.7  --   --   < > = values in this interval not displayed. ------------------------------------------------------------------------------------------------------------------  Cardiac Enzymes  Recent Labs Lab 03/23/15 1833  TROPONINI 0.04*   ------------------------------------------------------------------------------------------------------------------  RADIOLOGY:  No results found.  EKG:   Orders placed or performed during the hospital encounter of 03/23/15  . ED EKG  . ED EKG  . EKG 12-Lead  . EKG 12-Lead    ASSESSMENT AND PLAN:   79 year old female with past medical history of renal transplant, diabetes, hypertension, history of gout, atrial flutter, who presented to the hospital due to weakness, dizziness and presyncopal symptoms and noted to be significantly bradycardic.  #1 symptomatic bradycardia-patient's heart rate is significantly low in the mid to high 30s.  She is symptomatic at the time of admission with dizziness and weakness which is resolved now Scheduled for pacemaker placement on Monday  -I will hold her metoprolol, Cardizem.  -Appreciate  cardiology consult.-Dr. paraschos   #2 atrial fibrillation with RVR with history of atrial flutter-with prolonged  pauses Cardizem drip is discontinued -Discontinue  Xarelto and will start her on Eliquis 2.5  mg by mouth twice a day after  PPM placement CHADS 2 Vasc is 4 for age, female gender and hypertension  #3 acute on chronic renal failure-this is likely ATN secondary to underlying hypotension and bradycardia. -I will follow BUN and creatinine and urine output. Provided IV fluids  nephrology consult is placed  #4 hyperkalemia-this is likely secondary to the acute on chronic renal failure. Potassium is still elevated will give Kayexalate- I will repeat a potassium level in the morning.  #5  history of renal transplant-continue mycophenolate, prednisone, Prograf  #6 history of COPD-no acute exacerbation. Continue Spiriva, Dulera.  #7 hypertension-I will hold patient's antihypertensives given patient's relative hypotension presently.  #DVT prophylaxis with heparin subcutaneous  All the records are reviewed and case discussed with ED provider. Management plans discussed with the patient, family and they are in agreement.      All the records are reviewed and case discussed with Care Management/Social Workerr. Management plans discussed with the patient, family and they are in agreement.  CODE STATUS: fc  TOTAL CRITICAL CARE TIME TAKING CARE OF THIS PATIENT: 35 minutes.   POSSIBLE D/C IN 3-4 DAYS, DEPENDING ON CLINICAL CONDITION.   Ramonita LabGouru, Isrrael Fluckiger M.D on 03/24/2015 at 5:34 PM  Between 7am to 6pm - Pager - [phone] After 6pm go to [email or web address] - password [email or web address]  Fabio Neighborsagle [place] [facility]  Office  [phone]  CC: Primary care physician; Pcp Not In System

## 2015-03-24 NOTE — Progress Notes (Signed)
Inpatient Diabetes Program Recommendations  AACE/ADA: New Consensus Statement on Inpatient Glycemic Control (2015)  Target Ranges:  Prepandial:   less than 140 mg/dL      Peak postprandial:   less than 180 mg/dL (1-2 hours)      Critically ill patients:  140 - 180 mg/dL   Review of Glycemic Control  Diabetes history: Type 2,   A1C 7.3% on 01/06/15 Outpatient Diabetes medications: none Current orders for Inpatient glycemic control: Novolog 0-5 units qhs, Novolog 0-9 units tid  Inpatient Diabetes Program Recommendations: Agree with current orders for diabetes management.   Per ADA recommendations "consider performing an A1C on all patients with diabetes or hypreglycemia admitted to the hospital if not performed in the prior 3 months".  Susette RacerJulie Teal Raben, RN, BA, MHA, CDE Diabetes Coordinator Inpatient Diabetes Program  [phone] (Team Pager) [phone] [facility] Office) 03/24/2015 10:35 AM

## 2015-03-25 DIAGNOSIS — I495 Sick sinus syndrome: Secondary | ICD-10-CM | POA: Diagnosis not present

## 2015-03-25 LAB — BASIC METABOLIC PANEL
ANION GAP: 8 (ref 5–15)
BUN: 52 mg/dL — ABNORMAL HIGH (ref 6–20)
CHLORIDE: 112 mmol/L — AB (ref 101–111)
CO2: 22 mmol/L (ref 22–32)
Calcium: 8.6 mg/dL — ABNORMAL LOW (ref 8.9–10.3)
Creatinine, Ser: 1.98 mg/dL — ABNORMAL HIGH (ref 0.44–1.00)
GFR calc Af Amer: 27 mL/min — ABNORMAL LOW (ref >60)
GFR, EST NON AFRICAN AMERICAN: 23 mL/min — AB (ref >60)
GLUCOSE: 101 mg/dL — AB (ref 65–99)
POTASSIUM: 4.5 mmol/L (ref 3.5–5.1)
Sodium: 142 mmol/L (ref 135–145)

## 2015-03-25 LAB — CBC
HEMATOCRIT: 37.5 % (ref 35.0–47.0)
HEMOGLOBIN: 12 g/dL (ref 12.0–16.0)
MCH: 27.5 pg (ref 26.0–34.0)
MCHC: 32 g/dL (ref 32.0–36.0)
MCV: 85.9 fL (ref 80.0–100.0)
PLATELETS: 258 10*3/uL (ref 150–440)
RBC: 4.37 MIL/uL (ref 3.80–5.20)
RDW: 14.1 % (ref 11.5–14.5)
WBC: 7.8 10*3/uL (ref 3.6–11.0)

## 2015-03-25 LAB — GLUCOSE, CAPILLARY
GLUCOSE-CAPILLARY: 160 mg/dL — AB (ref 65–99)
GLUCOSE-CAPILLARY: 132 mg/dL — AB (ref 65–99)
Glucose-Capillary: 106 mg/dL — ABNORMAL HIGH (ref 65–99)
Glucose-Capillary: 142 mg/dL — ABNORMAL HIGH (ref 65–99)

## 2015-03-25 MED ORDER — METOPROLOL TARTRATE 25 MG PO TABS
25.0000 mg | ORAL_TABLET | Freq: Four times a day (QID) | ORAL | Status: DC
  Administered 2015-03-25: 25 mg via ORAL
  Filled 2015-03-25: qty 1

## 2015-03-25 MED ORDER — METOPROLOL TARTRATE 25 MG PO TABS
25.0000 mg | ORAL_TABLET | Freq: Four times a day (QID) | ORAL | Status: DC
  Administered 2015-03-25 – 2015-03-27 (×8): 25 mg via ORAL
  Filled 2015-03-25 (×8): qty 1

## 2015-03-25 MED ORDER — HYDRALAZINE HCL 20 MG/ML IJ SOLN
10.0000 mg | Freq: Four times a day (QID) | INTRAMUSCULAR | Status: DC | PRN
  Administered 2015-03-26: 10 mg via INTRAVENOUS
  Filled 2015-03-25: qty 1

## 2015-03-25 NOTE — Progress Notes (Signed)
At start of shift pt had A-fib with hr 150's, pt was normotensive.  Pt received iv metoprolol.  Since that time, pt in a-fib/a-flutter with hr ranging from 85-110 sustained, on exertion hr will increase to about 140 momentarily.  VSS, afebrile.  Pt is alert and oriented, no complaints of pain.  Room air, lungs clear.  Pt currently resting and sleeping as she has done most of the night.  Pt up to BCS with adequate urine output.

## 2015-03-25 NOTE — Progress Notes (Signed)
Spoke to Dr. Sherryll BurgerShah regarding patient's BP trending up, currently 176/77; MD to place orders for PRN BP meds.  Will continue to monitor.

## 2015-03-25 NOTE — Consult Note (Signed)
[facility] CONSULT NOTE    Date: 03/25/2015                  Patient Name:  Wendy Key  MRN: [id number]  DOB: Sep 18, 1935  Age / Sex: 79 y.o., female         PCP: Pcp Not In System                 Service Requesting Consult: Dr. Margaretmary Eddy                 Reason for Consult: Acute renal failure in the setting of renal transplantation.            History of Present Illness: Patient is a 79 y.o. female with a PMHx of cadaveric renal transplantation 2005 at [facility], hypertension, hyperlipidemia, diabetes mellitus type 2, coronary artery disease, asthma, peripheral vascular disease, gout, who was admitted to [facility] on 03/23/2015 for evaluation of dizziness and symptomatically bradycardia. The patient had recent admission here [facility]. She presented again with dizziness and lightheadedness. She was found to have a heart rate in the 30s. She is currently being considered for pacemaker placement. We are asked to see her for evaluation management of acute renal failure in the setting of known renal transplantation. Patient reports to me today that she has been adherent with her immunosuppression. She is currently taking Myfortic, tacrolimus, and prednisone. She has upcoming appointment with [facility] transfer on Center in December. It appears that her baseline creatinine is 1.4 with an EGFR of 40.   Medications: Outpatient medications: Prescriptions prior to admission  Medication Sig Dispense Refill Last Dose  . Calcium Carbonate-Vitamin D (CALCIUM PLUS VITAMIN D PO) Take 1 tablet by mouth daily.   03/22/2015 at Unknown time  . chlorthalidone (HYGROTON) 25 MG tablet Take 25 mg by mouth daily.   03/22/2015 at Unknown time  . cloNIDine (CATAPRES) 0.1 MG tablet Take 0.1 mg by mouth 3 (three) times daily.   03/23/2015 at Unknown time  . clopidogrel (PLAVIX) 75 MG tablet Take 75 mg by mouth daily.   03/22/2015 at Unknown time  . diltiazem (CARDIZEM LA) 240 MG 24  hr tablet Take 1 tablet (240 mg total) by mouth daily. 30 tablet 0 03/23/2015 at 0830  . enalapril (VASOTEC) 20 MG tablet Take 20 mg by mouth 2 (two) times daily.   03/22/2015 at Unknown time  . felodipine (PLENDIL) 10 MG 24 hr tablet Take 10 mg by mouth 2 (two) times daily.   03/23/2015 at Unknown time  . Fluticasone-Salmeterol (ADVAIR) 250-50 MCG/DOSE AEPB Inhale 1 puff into the lungs every 12 (twelve) hours.   03/22/2015 at Unknown time  . metoprolol (LOPRESSOR) 100 MG tablet Take 100 mg by mouth 2 (two) times daily.   03/23/2015 at 0830  . metoprolol tartrate (LOPRESSOR) 25 MG tablet Take 1 tablet (25 mg total) by mouth 2 (two) times daily. 60 tablet 0 03/23/2015 at 0830  . mycophenolate (MYFORTIC) 360 MG TBEC Take 360 mg by mouth 2 (two) times daily.   03/23/2015 at Unknown time  . pravastatin (PRAVACHOL) 40 MG tablet Take 40 mg by mouth daily.   03/22/2015 at Unknown time  . predniSONE (DELTASONE) 5 MG tablet Take 5 mg by mouth daily.   03/23/2015 at Unknown time  . Rivaroxaban (XARELTO) 15 MG TABS tablet Take 1 tablet (15 mg total) by mouth daily with supper. 30 tablet 30 03/23/2015 at 1300  . spironolactone (ALDACTONE) 25 MG  tablet Take 25 mg by mouth daily.   03/22/2015 at Unknown time  . tacrolimus (PROGRAF) 1 MG capsule Take 2 mg by mouth 2 (two) times daily.   03/23/2015 at Unknown time  . tiotropium (SPIRIVA) 18 MCG inhalation capsule Place 18 mcg into inhaler and inhale daily.   03/22/2015 at Unknown time    Current medications: Current Facility-Administered Medications  Medication Dose Route Frequency Provider Last Rate Last Dose  . 0.9 %  sodium chloride infusion   Intravenous Continuous Henreitta Leber, MD 100 mL/hr at 03/24/15 2127    . acetaminophen (TYLENOL) tablet 650 mg  650 mg Oral Q6H PRN Henreitta Leber, MD       Or  . acetaminophen (TYLENOL) suppository 650 mg  650 mg Rectal Q6H PRN Henreitta Leber, MD      . calcium-vitamin D (OSCAL WITH D) 500-200 MG-UNIT per tablet 1  tablet  1 tablet Oral Daily Henreitta Leber, MD   1 tablet at 03/25/15 1021  . heparin injection 5,000 Units  5,000 Units Subcutaneous 3 times per day Henreitta Leber, MD   5,000 Units at 03/25/15 0602  . insulin aspart (novoLOG) injection 0-5 Units  0-5 Units Subcutaneous QHS Raylene Miyamoto, MD   0 Units at 03/24/15 0043  . insulin aspart (novoLOG) injection 0-9 Units  0-9 Units Subcutaneous TID WC Raylene Miyamoto, MD   2 Units at 03/24/15 1634  . metoprolol (LOPRESSOR) injection 2.5 mg  2.5 mg Intravenous Q3H PRN Juanito Doom, MD   2.5 mg at 03/25/15 0810  . metoprolol tartrate (LOPRESSOR) tablet 25 mg  25 mg Oral QID Isaias Cowman, MD   25 mg at 03/25/15 1020  . mometasone-formoterol (DULERA) 100-5 MCG/ACT inhaler 2 puff  2 puff Inhalation BID Henreitta Leber, MD   2 puff at 03/25/15 0809  . mycophenolate (MYFORTIC) EC tablet 360 mg  360 mg Oral BID Henreitta Leber, MD   360 mg at 03/25/15 1020  . ondansetron (ZOFRAN) tablet 4 mg  4 mg Oral Q6H PRN Henreitta Leber, MD       Or  . ondansetron [facility]) injection 4 mg  4 mg Intravenous Q6H PRN Henreitta Leber, MD   4 mg at 03/23/15 2025  . pravastatin (PRAVACHOL) tablet 40 mg  40 mg Oral Daily Henreitta Leber, MD   40 mg at 03/25/15 1021  . predniSONE (DELTASONE) tablet 5 mg  5 mg Oral Q breakfast Henreitta Leber, MD   5 mg at 03/25/15 0809  . promethazine (PHENERGAN) injection 12.5 mg  12.5 mg Intravenous Q6H PRN Lance Coon, MD   12.5 mg at 03/23/15 2114  . sodium chloride 0.9 % injection 3 mL  3 mL Intravenous Q12H Henreitta Leber, MD   3 mL at 03/25/15 1023  . tacrolimus (PROGRAF) capsule 2 mg  2 mg Oral BID Henreitta Leber, MD   2 mg at 03/25/15 1020  . tiotropium (SPIRIVA) inhalation capsule 18 mcg  18 mcg Inhalation Daily Henreitta Leber, MD   18 mcg at 03/25/15 1020      Allergies: No Known Allergies    Past Medical History: Past Medical History  Diagnosis Date  . S/P kidney transplant 11/13/2011    cadevaric  transplant  2005 at [place] by Dr. Kandis Nab  . Hypertension 11/13/2011  . Hyperlipidemia 11/13/2011  . Diabetes mellitus type II 11/13/2011  . CAD (coronary artery disease) 11/13/2011  50% LAD lesion diagnosed at [facility]  . Asthma 11/13/2011  . Peripheral vascular disease ([place]) 11/13/2011  . Gout 11/13/2011     Past Surgical History: Past Surgical History  Procedure Laterality Date  . Kidney transplant  2005    At [facility]  . Av fistula placement      x2 with removal of left arm fistula  . Abdominal hysterectomy  1977    for fibroids     Family History: Family History  Problem Relation Age of Onset  . Diabetes Father   . Diabetes type II Son      Social History: Social History   Social History  . Marital Status: Widowed    Spouse Name: N/A  . Number of Children: N/A  . Years of Education: N/A   Occupational History  . Not on file.   Social History Main Topics  . Smoking status: Never Smoker   . Smokeless tobacco: Never Used  . Alcohol Use: No  . Drug Use: No  . Sexual Activity: Not Currently   Other Topics Concern  . Not on file   Social History Narrative   Patient lives in [place] with her Daughter.  PCP is Dr. Rich Brave in [place].      Review of Systems: As per HPI  Vital Signs: Blood pressure 151/94, pulse 129, temperature 97.8 F (36.6 C), temperature source Oral, resp. rate 21, height 5' 6"  (1.676 m), weight 71.8 kg (158 lb 4.6 oz), SpO2 98 %.  Weight trends: Filed Weights   03/23/15 2137 03/25/15 0400  Weight: 69.6 kg (153 lb 7 oz) 71.8 kg (158 lb 4.6 oz)    Physical Exam: General: NAD, resting comfortably.  Head: Normocephalic, atraumatic.  Eyes: Anicteric, EOMI  Nose: Mucous membranes moist, not inflammed, nonerythematous.  Throat: Oropharynx nonerythematous, no exudate appreciated.   Neck: Supple trachea midline  Lungs:  Normal respiratory effort. Clear to auscultation BL without crackles or wheezes.  Heart:  S1S2 currently tachycardic and irregular.  Abdomen:  BS normoactive. Soft, Nondistended, non-tender.  No masses or organomegaly.  Extremities: No pretibial edema.  Neurologic: A&O X3, Motor strength is 5/5 in the all 4 extremities  Skin: No visible rashes, scars.    Lab results: Basic Metabolic Panel:  Recent Labs Lab 03/23/15 1833 03/24/15 0433 03/25/15 0619  NA 135 136 142  K 5.6* 5.5* 4.5  CL 104 107 112*  CO2 19* 17* 22  GLUCOSE 271* 124* 101*  BUN 72* 78* 52*  CREATININE 3.98* 3.72* 1.98*  CALCIUM 9.0 8.5* 8.6*    Liver Function Tests: No results for input(s): AST, ALT, ALKPHOS, BILITOT, PROT, ALBUMIN in the last 168 hours. No results for input(s): LIPASE, AMYLASE in the last 168 hours. No results for input(s): AMMONIA in the last 168 hours.  CBC:  Recent Labs Lab 03/20/15 0154 03/23/15 1833 03/24/15 0433 03/25/15 0619  WBC 8.9 9.4 9.8 7.8  HGB 13.7 12.6 11.2* 12.0  HCT 42.8 38.9 34.9* 37.5  MCV 85.7 88.3 86.0 85.9  PLT 208 260 244 258    Cardiac Enzymes:  Recent Labs Lab 03/23/15 1833  TROPONINI 0.04*    BNP: Invalid input(s): POCBNP  CBG:  Recent Labs Lab 03/24/15 1149 03/24/15 1629 03/24/15 2106 03/25/15 0706 03/25/15 1116  GLUCAP 225* 191* 188* 106* 142*    Microbiology: Results for orders placed or performed during the hospital encounter of 03/23/15  MRSA PCR Screening     Status: None   Collection Time: 03/23/15  9:30 PM  Result Value Ref Range Status   MRSA by PCR NEGATIVE NEGATIVE Final    Comment:        The GeneXpert MRSA Assay (FDA approved for NASAL specimens only), is one component of a comprehensive MRSA colonization surveillance program. It is not intended to diagnose MRSA infection nor to guide or monitor treatment for MRSA infections.     Coagulation Studies: No results for input(s): LABPROT, INR in the last 72 hours.  Urinalysis: No results for input(s): COLORURINE, LABSPEC, PHURINE, GLUCOSEU, HGBUR,  BILIRUBINUR, KETONESUR, PROTEINUR, UROBILINOGEN, NITRITE, LEUKOCYTESUR in the last 72 hours.  Invalid input(s): APPERANCEUR    Imaging:  No results found.   Assessment & Plan: Pt is a 79 y.o. yo female with a PMHx of cadaveric renal transplantation 2005 at [facility], hypertension, hyperlipidemia, diabetes mellitus type 2, coronary artery disease, asthma, peripheral vascular disease, gout, who was admitted to [facility] on 03/23/2015 for evaluation of dizziness and symptomatically bradycardia.   1.  Acute renal failure due to ATN (N17.0)/s/p renal transplantation status:  The patient presented with bradycardia which was symptomatic. This likely resulted in acute renal failure from acute tubular necrosis. We will proceed with renal ultrasound to make sure there is no obstruction in the transplanted kidney. Creatinine has improved with IV fluid hydration. Therefore we will continue this. We will check tacrolimus level as well. Continue current dosage of Myfortic as well as prednisone. Further plan as patient progresses.  2. Bradycardia. The patient was actually symptomatic from this. She is being considered for pacemaker placement.  3. Hyperkalemia. Serum potassium was 5.6 upon admission and is now down to 4.5. Continue IV fluid hydration. We plan to follow-up serum potassium tomorrow.  4. Thank you for consultation.

## 2015-03-25 NOTE — Progress Notes (Signed)
[facility] - [place] at [facility]   PATIENT NAME: Wendy Key    MR#:  [id number]  DATE OF BIRTH:  26-Dec-1935  SUBJECTIVE:  CHIEF COMPLAINT:  Patient is in afib with RVR at 150's . Reporting heart racing . Denies any dizziness, chest pain or shortness of breath  REVIEW OF SYSTEMS:  CONSTITUTIONAL: No fever, fatigue or weakness.  EYES: No blurred or double vision.  EARS, NOSE, AND THROAT: No tinnitus or ear pain.  RESPIRATORY: No cough, shortness of breath, wheezing or hemoptysis.  CARDIOVASCULAR: Reporting palpitations .denies any dizziness .No chest pain, orthopnea, edema.  GASTROINTESTINAL: No nausea, vomiting, diarrhea or abdominal pain.  GENITOURINARY: No dysuria, hematuria.  ENDOCRINE: No polyuria, nocturia,  HEMATOLOGY: No anemia, easy bruising or bleeding SKIN: No rash or lesion. MUSCULOSKELETAL: No joint pain or arthritis.   NEUROLOGIC: No tingling, numbness, weakness.  PSYCHIATRY: No anxiety or depression.   DRUG ALLERGIES:  No Known Allergies  VITALS:  Blood pressure 151/94, pulse 129, temperature 97.8 F (36.6 C), temperature source Oral, resp. rate 21, height  (1.676 m), weight 71.8 kg (158 lb 4.6 oz), SpO2 98 %.  PHYSICAL EXAMINATION:  GENERAL:  79 y.o.-year-old patient lying in the bed with no acute distress.  EYES: Pupils equal, round, reactive to light and accommodation. No scleral icterus. Extraocular muscles intact.  HEENT: Head atraumatic, normocephalic. Oropharynx and nasopharynx clear.  NECK:  Supple, no jugular venous distention. No thyroid enlargement, no tenderness.  LUNGS: Normal breath sounds bilaterally, no wheezing, rales,rhonchi or crepitation. No use of accessory muscles of respiration.  CARDIOVASCULAR: A. fib with RVR. No murmurs, rubs, or gallops.  ABDOMEN: Soft, nontender, nondistended. Bowel sounds present. No organomegaly or mass.  EXTREMITIES: No pedal edema, cyanosis, or clubbing.  NEUROLOGIC: Cranial  nerves II through XII are intact. Muscle strength 5/5 in all extremities. Sensation intact. Gait not checked.  PSYCHIATRIC: The patient is alert and oriented x 3.  SKIN: No obvious rash, lesion, or ulcer.    LABORATORY PANEL:   CBC  Recent Labs Lab 03/25/15 0619  WBC 7.8  HGB 12.0  HCT 37.5  PLT 258   ------------------------------------------------------------------------------------------------------------------  Chemistries   Recent Labs Lab 03/25/15 0619  NA 142  K 4.5  CL 112*  CO2 22  GLUCOSE 101*  BUN 52*  CREATININE 1.98*  CALCIUM 8.6*   ------------------------------------------------------------------------------------------------------------------  Cardiac Enzymes  Recent Labs Lab 03/23/15 1833  TROPONINI 0.04*   ------------------------------------------------------------------------------------------------------------------  RADIOLOGY:  No results found.  EKG:   Orders placed or performed during the hospital encounter of 03/23/15  . ED EKG  . ED EKG  . EKG 12-Lead  . EKG 12-Lead    ASSESSMENT AND PLAN:   79 year old female with past medical history of renal transplant, diabetes, hypertension, history of gout, atrial flutter, who presented to the hospital due to weakness, dizziness and presyncopal symptoms and noted to be significantly bradycardic.  #1 symptomatic bradycardia-patient's heart rate is significantly low in the mid to high 30s.  She is symptomatic at the time of admission with dizziness and weakness which is resolved now Scheduled for pacemaker placement on Monday 11/14 -Scheduled for permanent pacemaker placement on Monday -Appreciate  cardiology consult.-Dr. paraschos   #2 atrial fibrillation with RVR with history of atrial flutter-with prolonged pauses Patient is started on metoprolol 25 by mouth every 6 hours and will give Toprol 2.5 mg IV as needed basis for RVR Cardizem drip is discontinued -Discontinue  Xarelto and  will start her on  Eliquis 2.5  mg by mouth twice a day after PPM placement CHADS 2 Vasc is 4 for age, female gender and hypertension  #3 acute on chronic renal failure-this is likely ATN secondary to underlying hypotension and bradycardia. Renal ultrasound is ordered which is pending Renal function is improving with IV fluids, creatinine is better at 1.98 Repeat a.m. labs -Appreciate nephrology recommendations   #4 hyperkalemia-this is likely secondary to the acute on chronic renal failure. Resolved with Kayexalate  #5  history of renal transplant-continue mycophenolate, prednisone, Prograf  #6 history of COPD-no acute exacerbation. Continue Spiriva, Dulera.  #7 hypertension-I will hold patient's antihypertensives given patient's relative hypotension presently.  #DVT prophylaxis with heparin subcutaneous  All the records are reviewed and case discussed with ED provider. Management plans discussed with the patient, daughter at bedside and they are in agreement.      All the records are reviewed and case discussed with Care Management/Social Workerr. Management plans discussed with the patient, family and they are in agreement.  CODE STATUS: fc  TOTAL CRITICAL CARE TIME TAKING CARE OF THIS PATIENT: 35 minutes.   POSSIBLE D/C IN 3-4 DAYS, DEPENDING ON CLINICAL CONDITION.   Ramonita LabGouru, Javiel Canepa M.D on 03/25/2015 at 2:24 PM  Between 7am to 6pm - Pager - [phone] After 6pm go to [email or web address] - password [email or web address]  Fabio Neighborsagle  Hospitalists  Office  [phone]  CC: Primary care physician; Pcp Not In System

## 2015-03-25 NOTE — Progress Notes (Signed)
[facility]  SUBJECTIVE: I have some palpitations   Filed Vitals:   03/25/15 0600 03/25/15 0700 03/25/15 0756 03/25/15 0800  BP: 143/78 165/122  161/99  Pulse: 82 64  155  Temp:   97.8 F (36.6 C)   TempSrc:   Oral   Resp: 35 21  21  Height:      Weight:      SpO2: 99% 99%  98%     Intake/Output Summary (Last 24 hours) at 03/25/15 0913 Last data filed at 03/25/15 0600  Gross per 24 hour  Intake   1240 ml  Output   1125 ml  Net    115 ml      PHYSICAL EXAM  General: Well developed, well nourished, in no acute distress HEENT:  Normocephalic and atramatic Neck:  No JVD.  Lungs: Clear bilaterally to auscultation and percussion. Heart: Irregular irregular rhythm.  Normal S1 and S2 without gallops or murmurs.  Abdomen: Bowel sounds are positive, abdomen soft and non-tender  Msk:  Back normal, normal gait. Normal strength and tone for age. Extremities: No clubbing, cyanosis or edema.   Neuro: Alert and oriented X 3. Psych:  Good affect, responds appropriately   LABS: Basic Metabolic Panel:  Recent Labs  16/02/9610/11/16 0433 03/25/15 0619  NA 136 142  K 5.5* 4.5  CL 107 112*  CO2 17* 22  GLUCOSE 124* 101*  BUN 78* 52*  CREATININE 3.72* 1.98*  CALCIUM 8.5* 8.6*   Liver Function Tests: No results for input(s): AST, ALT, ALKPHOS, BILITOT, PROT, ALBUMIN in the last 72 hours. No results for input(s): LIPASE, AMYLASE in the last 72 hours. CBC:  Recent Labs  03/24/15 0433 03/25/15 0619  WBC 9.8 7.8  HGB 11.2* 12.0  HCT 34.9* 37.5  MCV 86.0 85.9  PLT 244 258   Cardiac Enzymes:  Recent Labs  03/23/15 1833  TROPONINI 0.04*   BNP: Invalid input(s): POCBNP D-Dimer: No results for input(s): DDIMER in the last 72 hours. Hemoglobin A1C: No results for input(s): HGBA1C in the last 72 hours. Fasting Lipid Panel: No results for input(s): CHOL, HDL, LDLCALC, TRIG, CHOLHDL, LDLDIRECT in the last 72 hours. Thyroid Function Tests: No results for input(s): TSH,  T4TOTAL, T3FREE, THYROIDAB in the last 72 hours.  Invalid input(s): FREET3 Anemia Panel: No results for input(s): VITAMINB12, FOLATE, FERRITIN, TIBC, IRON, RETICCTPCT in the last 72 hours.  No results found.   Echo:  EF 60-65%  TELEMETRY: Atrial fibrillation/atrial flutter:  ASSESSMENT AND PLAN:  Active Problems:   Symptomatic bradycardia    1. Symptomatic bradycardia, resolved, after holding metoprolol and Cardizem, now tachycardic 2. Essential hypertension, blood pressure elevated  Recommendations  1. Restart metoprolol 25 mg by mouth every 6 hr 2. Continue to hold Xarelto and Plavix 3. Permanent pacemaker pending 03/27/2015           Marcina MillardPARASCHOS,Tryniti Laatsch, MD, PhD, [facility] 03/25/2015 9:13 AM

## 2015-03-25 NOTE — Progress Notes (Signed)
HR up to 150s at time, BP also elevated with diastolic of 110+, medicated prn with IV lopressor, with little change, Dr Cassie FreerParachos aware and po med ordered

## 2015-03-25 NOTE — Progress Notes (Signed)
Patient's HR continues to be varaible, A fib, rate 110-150s, MDs aware, IV lopressor given x 2 , Po lopressor started, Family aware and awaiting a pacemaker on Monday. Pt remains awake and alert, skin warm and dry and no distress noted

## 2015-03-26 ENCOUNTER — Inpatient Hospital Stay: Payer: Medicare Other

## 2015-03-26 LAB — GLUCOSE, CAPILLARY
GLUCOSE-CAPILLARY: 115 mg/dL — AB (ref 65–99)
GLUCOSE-CAPILLARY: 163 mg/dL — AB (ref 65–99)
Glucose-Capillary: 156 mg/dL — ABNORMAL HIGH (ref 65–99)
Glucose-Capillary: 154 mg/dL — ABNORMAL HIGH (ref 65–99)

## 2015-03-26 LAB — CBC
HCT: 41.3 % (ref 35.0–47.0)
Hemoglobin: 12.9 g/dL (ref 12.0–16.0)
MCH: 27.1 pg (ref 26.0–34.0)
MCHC: 31.3 g/dL — AB (ref 32.0–36.0)
MCV: 86.6 fL (ref 80.0–100.0)
PLATELETS: 316 10*3/uL (ref 150–440)
RBC: 4.77 MIL/uL (ref 3.80–5.20)
RDW: 14.5 % (ref 11.5–14.5)
WBC: 10.6 10*3/uL (ref 3.6–11.0)

## 2015-03-26 LAB — BASIC METABOLIC PANEL
ANION GAP: 9 (ref 5–15)
BUN: 36 mg/dL — AB (ref 6–20)
CALCIUM: 9.5 mg/dL (ref 8.9–10.3)
CO2: 21 mmol/L — AB (ref 22–32)
CREATININE: 1.44 mg/dL — AB (ref 0.44–1.00)
Chloride: 112 mmol/L — ABNORMAL HIGH (ref 101–111)
GFR calc Af Amer: 39 mL/min — ABNORMAL LOW (ref >60)
GFR calc non Af Amer: 34 mL/min — ABNORMAL LOW (ref >60)
GLUCOSE: 117 mg/dL — AB (ref 65–99)
Potassium: 4.2 mmol/L (ref 3.5–5.1)
SODIUM: 142 mmol/L (ref 135–145)

## 2015-03-26 MED ORDER — HYDRALAZINE HCL 20 MG/ML IJ SOLN
10.0000 mg | INTRAMUSCULAR | Status: DC | PRN
  Administered 2015-03-28: 10 mg via INTRAVENOUS
  Filled 2015-03-26: qty 1

## 2015-03-26 MED ORDER — ALPRAZOLAM 0.25 MG PO TABS
0.2500 mg | ORAL_TABLET | Freq: Once | ORAL | Status: AC
  Administered 2015-03-26: 0.25 mg via ORAL
  Filled 2015-03-26: qty 1

## 2015-03-26 MED ORDER — METOPROLOL TARTRATE 1 MG/ML IV SOLN
2.5000 mg | Freq: Once | INTRAVENOUS | Status: AC
  Administered 2015-03-26: 2.5 mg via INTRAVENOUS

## 2015-03-26 MED ORDER — CLONIDINE HCL 0.1 MG PO TABS
0.1000 mg | ORAL_TABLET | Freq: Three times a day (TID) | ORAL | Status: DC
  Administered 2015-03-26 – 2015-03-29 (×9): 0.1 mg via ORAL
  Filled 2015-03-26 (×11): qty 1

## 2015-03-26 NOTE — Progress Notes (Signed)
[facility]  ROUNDING NOTE   Subjective:  Pt seen at bedside. Feeling better. Renal function improved today, Cr down to 1.44.  Renal US pending.   Objective:  Vital signs in last 24 hours:  Temp:  [98 F (36.7 C)-98.5 F (36.9 C)] 98.5 F (36.9 C) (11/13 0400) Pulse Rate:  [43-155] 72 (11/13 0630) Resp:  [16-27] 22 (11/13 0630) BP: (133-183)/(73-139) 146/73 mmHg (11/13 0630) SpO2:  [95 %-100 %] 98 % (11/13 0630)  Weight change:  Filed Weights   03/23/15 2137 03/25/15 0400  Weight: 69.6 kg (153 lb 7 oz) 71.8 kg (158 lb 4.6 oz)    Intake/Output: I/O last 3 completed shifts: In: 1480 [P.O.:480; IV Piggyback:1000] Out: 1575 [Urine:1575]   Intake/Output this shift:  Total I/O In: -  Out: 200 [Urine:200]  Physical Exam: General: NAD  Head: Normocephalic, atraumatic. Moist oral mucosal membranes  Eyes: Anicteric  Neck: Supple, trachea midline  Lungs:  Clear to auscultation normal effort  Heart: S1S2 slight tachycardia  Abdomen:  Soft, nontender, BS present  Extremities:  no peripheral edema.  Neurologic: Nonfocal, moving all four extremities  Skin: No lesions       Basic Metabolic Panel:  Recent Labs Lab 03/21/15 0403  03/23/15 1833 03/24/15 0433 03/25/15 0619 03/26/15 0517  NA  --   --  135 136 142 142  K  --   --  5.6* 5.5* 4.5 4.2  CL  --   --  104 107 112* 112*  CO2  --   --  19* 17* 22 21*  GLUCOSE  --   --  271* 124* 101* 117*  BUN  --   --  72* 78* 52* 36*  CREATININE 2.12*  --  3.98* 3.72* 1.98* 1.44*  CALCIUM  --   < > 9.0 8.5* 8.6* 9.5  < > = values in this interval not displayed.  Liver Function Tests: No results for input(s): AST, ALT, ALKPHOS, BILITOT, PROT, ALBUMIN in the last 168 hours. No results for input(s): LIPASE, AMYLASE in the last 168 hours. No results for input(s): AMMONIA in the last 168 hours.  CBC:  Recent Labs Lab 03/20/15 0154 03/23/15 1833 03/24/15 0433 03/25/15 0619 03/26/15 0517  WBC 8.9 9.4 9.8  7.8 10.6  HGB 13.7 12.6 11.2* 12.0 12.9  HCT 42.8 38.9 34.9* 37.5 41.3  MCV 85.7 88.3 86.0 85.9 86.6  PLT 208 260 244 258 316    Cardiac Enzymes:  Recent Labs Lab 03/23/15 1833  TROPONINI 0.04*    BNP: Invalid input(s): POCBNP  CBG:  Recent Labs Lab 03/25/15 0706 03/25/15 1116 03/25/15 1630 03/25/15 2152 03/26/15 0650  GLUCAP 106* 142* 160* 132* 115*    Microbiology: Results for orders placed or performed during the hospital encounter of 03/23/15  MRSA PCR Screening     Status: None   Collection Time: 03/23/15  9:30 PM  Result Value Ref Range Status   MRSA by PCR NEGATIVE NEGATIVE Final    Comment:        The GeneXpert MRSA Assay (FDA approved for NASAL specimens only), is one component of a comprehensive MRSA colonization surveillance program. It is not intended to diagnose MRSA infection nor to guide or monitor treatment for MRSA infections.     Coagulation Studies: No results for input(s): LABPROT, INR in the last 72 hours.  Urinalysis: No results for input(s): COLORURINE, LABSPEC, PHURINE, GLUCOSEU, HGBUR, BILIRUBINUR, KETONESUR, PROTEINUR, UROBILINOGEN, NITRITE, LEUKOCYTESUR in the last 72 hours.  Invalid input(s): APPERANCEUR  Imaging: No results found.   Medications:   . sodium chloride 100 mL/hr at 03/24/15 2127   . calcium-vitamin D  1 tablet Oral Daily  . cloNIDine  0.1 mg Oral TID  . heparin  5,000 Units Subcutaneous 3 times per day  . insulin aspart  0-5 Units Subcutaneous QHS  . insulin aspart  0-9 Units Subcutaneous TID WC  . metoprolol tartrate  25 mg Oral QID  . mometasone-formoterol  2 puff Inhalation BID  . mycophenolate  360 mg Oral BID  . pravastatin  40 mg Oral Daily  . predniSONE  5 mg Oral Q breakfast  . sodium chloride  3 mL Intravenous Q12H  . tacrolimus  2 mg Oral BID  . tiotropium  18 mcg Inhalation Daily   acetaminophen **OR** acetaminophen, hydrALAZINE, metoprolol, ondansetron **OR** ondansetron (ZOFRAN)  IV, promethazine  Assessment/ Plan:  79 y.o. female with a PMHx of cadaveric renal transplantation 2005 at [facility], hypertension, hyperlipidemia, diabetes mellitus type 2, coronary artery disease, asthma, peripheral vascular disease, gout, who was admitted to [facility] on 03/23/2015 for evaluation of dizziness and symptomatically bradycardia.   1. Acute renal failure due to ATN (N17.0)/s/p renal transplantation status: The patient presented with bradycardia which was symptomatic. This likely resulted in acute renal failure from acute tubular necrosis. -renal function continues to improve, continue IVF hydration for now, awaiting renal US and tacrolimus level.  Continue current immunosuppression.  2. Bradycardia. Actually improved a bit tachycardic now, may have tachy/brady syndrome which was symptomatic.  3. Hyperkalemia. Improved from admission, K down to 4.2 at the moment.    LOS: 3 Delilah Mulgrew 11/13/20169:39 AM

## 2015-03-26 NOTE — Progress Notes (Signed)
eLink Physician-Brief Progress Note Patient Name: Wendy Key DOB: Apr 30, 1936 MRN: [id number]   Date of Service  03/26/2015  HPI/Events of Note  SBP = 181/96.  eICU Interventions  Will order: 1. Increase Hydralazine 10 mg IV frequency to Q 4 hours PRN.  2. Catapres 0.1 mg PO TID. Hold for SBP < 100.     Intervention Category Intermediate Interventions: Hypertension - evaluation and management  Wendy Key 03/26/2015, 4:37 AM

## 2015-03-26 NOTE — Progress Notes (Addendum)
eLink Physician-Brief Progress Note Patient Name: Wendy Key DOB: March 16, 1936 MRN: [id number]   Date of Service  03/26/2015  HPI/Events of Note  Multiple issues: 1. Sinus tachycardia - HR = 151. Patient is already on Metoprolol 2.5 mg IV Q 3 hours PRN and 2. Anxiety.   eICU Interventions  Will order: 1. Metoprolol 2.5 mg IV now (extra dose).  2. Xanax 0.25 mg PO X 1.     Intervention Category Major Interventions: Arrhythmia - evaluation and management Minor Interventions: Agitation / anxiety - evaluation and management  Sommer,Steven Eugene 03/26/2015, 5:24 AM

## 2015-03-26 NOTE — Progress Notes (Signed)
[facility]  SUBJECTIVE:  I had a rough night   Filed Vitals:   03/26/15 0513 03/26/15 0530 03/26/15 0600 03/26/15 0630  BP:  164/108 159/78 146/73  Pulse: 149 145 70 72  Temp:      TempSrc:      Resp:  19 23 22   Height:      Weight:      SpO2:  98% 98% 98%     Intake/Output Summary (Last 24 hours) at 03/26/15 1047 Last data filed at 03/26/15 0800  Gross per 24 hour  Intake    240 ml  Output    650 ml  Net   -410 ml      PHYSICAL EXAM  General: Well developed, well nourished, in no acute distress HEENT:  Normocephalic and atramatic Neck:  No JVD.  Lungs: Clear bilaterally to auscultation and percussion. Heart: irregular irregular rhythm. Normal S1 and S2 without gallops or murmurs.  Abdomen: Bowel sounds are positive, abdomen soft and non-tender  Msk:  Back normal, normal gait. Normal strength and tone for age. Extremities: No clubbing, cyanosis or edema.   Neuro: Alert and oriented X 3. Psych:  Good affect, responds appropriately   LABS: Basic Metabolic Panel:  Recent Labs  16/02/9610/12/16 0619 03/26/15 0517  NA 142 142  K 4.5 4.2  CL 112* 112*  CO2 22 21*  GLUCOSE 101* 117*  BUN 52* 36*  CREATININE 1.98* 1.44*  CALCIUM 8.6* 9.5   Liver Function Tests: No results for input(s): AST, ALT, ALKPHOS, BILITOT, PROT, ALBUMIN in the last 72 hours. No results for input(s): LIPASE, AMYLASE in the last 72 hours. CBC:  Recent Labs  03/25/15 0619 03/26/15 0517  WBC 7.8 10.6  HGB 12.0 12.9  HCT 37.5 41.3  MCV 85.9 86.6  PLT 258 316   Cardiac Enzymes:  Recent Labs  03/23/15 1833  TROPONINI 0.04*   BNP: Invalid input(s): POCBNP D-Dimer: No results for input(s): DDIMER in the last 72 hours. Hemoglobin A1C: No results for input(s): HGBA1C in the last 72 hours. Fasting Lipid Panel: No results for input(s): CHOL, HDL, LDLCALC, TRIG, CHOLHDL, LDLDIRECT in the last 72 hours. Thyroid Function Tests: No results for input(s): TSH, T4TOTAL, T3FREE, THYROIDAB  in the last 72 hours.  Invalid input(s): FREET3 Anemia Panel: No results for input(s): VITAMINB12, FOLATE, FERRITIN, TIBC, IRON, RETICCTPCT in the last 72 hours.  [place] [facility]  03/26/2015  CLINICAL DATA:  79 year old female with acute renal failure. EXAM: RENAL / URINARY TRACT ULTRASOUND COMPLETE COMPARISON:  None. FINDINGS: Right Native Kidney: Length: 10.8 cm. Diffusely increased echogenicity, cortical atrophy and multiple cysts identified. No definite hydronephrosis or solid mass. Left Native Kidney: Length: 11.6 cm. Diffusely increased echogenicity, cortical atrophy and multiple cysts identified. No definite hydronephrosis or solid mass. Transplant kidney: Length:  8.6 cm.  No solid mass or hydronephrosis. Bladder: Appears normal for degree of bladder distention. IMPRESSION: Slightly small transplant kidney without other significant abnormality. Chronic medical renal disease of both native kidneys with multiple cysts. No evidence of hydronephrosis or definite solid mass. Electronically Signed   By: [name]  Hu M.D.   On: 03/26/2015 10:42     Echo EF 60-65%  TELEMETRY: atrial flutter 90-100 bpm  ASSESSMENT AND PLAN:  Active Problems:   Symptomatic bradycardia    1. Symptomatic bradycardia, currently atrial flutter at a rate of 90 BPM 2. Essential hypertension  Recommendations  1. Continue current medications 2. Hold Xarelto and Plavix 3. Permanent pacemaker implantation 03/27/15 4. Resume Xarelto  after pacemaker     Wendy Roma, MD, PhD, [facility] 03/26/2015 10:47 AM

## 2015-03-26 NOTE — Progress Notes (Signed)
[facility] [place] [facility]   PATIENT NAME: Wendy Key    MR#:  [id number]  DATE OF BIRTH:  1935/11/26  SUBJECTIVE:  CHIEF COMPLAINT:  Patient is resting comfortably. Heart rate is well controlled today. No new complaints. No overnight events . Denies any dizziness, chest pain or shortness of breath  REVIEW OF SYSTEMS:  CONSTITUTIONAL: No fever, fatigue or weakness.  EYES: No blurred or double vision.  EARS, NOSE, AND THROAT: No tinnitus or ear pain.  RESPIRATORY: No cough, shortness of breath, wheezing or hemoptysis.  CARDIOVASCULAR:denies any dizziness , palpitations.No chest pain, orthopnea, edema.  GASTROINTESTINAL: No nausea, vomiting, diarrhea or abdominal pain.  GENITOURINARY: No dysuria, hematuria.  ENDOCRINE: No polyuria, nocturia,  HEMATOLOGY: No anemia, easy bruising or bleeding SKIN: No rash or lesion. MUSCULOSKELETAL: No joint pain or arthritis.   NEUROLOGIC: No tingling, numbness, weakness.  PSYCHIATRY: No anxiety or depression.   DRUG ALLERGIES:  No Known Allergies  VITALS:  Blood pressure 131/83, pulse 85, temperature 98.7 F (37.1 C), temperature source Oral, resp. rate 23, height 5\' 6"  (1.676 m), weight 71.8 kg (158 lb 4.6 oz), SpO2 98 %.  PHYSICAL EXAMINATION:  GENERAL:  79 y.o.-year-old patient lying in the bed with no acute distress.  EYES: Pupils equal, round, reactive to light and accommodation. No scleral icterus. Extraocular muscles intact.  HEENT: Head atraumatic, normocephalic. Oropharynx and nasopharynx clear.  NECK:  Supple, no jugular venous distention. No thyroid enlargement, no tenderness.  LUNGS: Normal breath sounds bilaterally, no wheezing, rales,rhonchi or crepitation. No use of accessory muscles of respiration.  CARDIOVASCULAR: Irregularly irregular. No murmurs, rubs, or gallops.  ABDOMEN: Soft, nontender, nondistended. Bowel sounds present. No organomegaly or mass.  EXTREMITIES: No pedal edema,  cyanosis, or clubbing.  NEUROLOGIC: Cranial nerves II through XII are intact. Muscle strength 5/5 in all extremities. Sensation intact. Gait not checked.  PSYCHIATRIC: The patient is alert and oriented x 3.  SKIN: No obvious rash, lesion, or ulcer.    LABORATORY PANEL:   CBC  Recent Labs Lab 03/26/15 0517  WBC 10.6  HGB 12.9  HCT 41.3  PLT 316   ------------------------------------------------------------------------------------------------------------------  Chemistries   Recent Labs Lab 03/26/15 0517  NA 142  K 4.2  CL 112*  CO2 21*  GLUCOSE 117*  BUN 36*  CREATININE 1.44*  CALCIUM 9.5   ------------------------------------------------------------------------------------------------------------------  Cardiac Enzymes  Recent Labs Lab 03/23/15 1833  TROPONINI 0.04*   ------------------------------------------------------------------------------------------------------------------  RADIOLOGY:  [place] Renal  03/26/2015  CLINICAL DATA:  79 year old female with acute renal failure. EXAM: RENAL / URINARY TRACT ULTRASOUND COMPLETE COMPARISON:  None. FINDINGS: Right Native Kidney: Length: 10.8 cm. Diffusely increased echogenicity, cortical atrophy and multiple cysts identified. No definite hydronephrosis or solid mass. Left Native Kidney: Length: 11.6 cm. Diffusely increased echogenicity, cortical atrophy and multiple cysts identified. No definite hydronephrosis or solid mass. Transplant kidney: Length:  8.6 cm.  No solid mass or hydronephrosis. Bladder: Appears normal for degree of bladder distention. IMPRESSION: Slightly small transplant kidney without other significant abnormality. Chronic medical renal disease of both native kidneys with multiple cysts. No evidence of hydronephrosis or definite solid mass. Electronically Signed   By: [name]  Hu M.D.   On: 03/26/2015 10:42    EKG:   Orders placed or performed during the hospital encounter of 03/23/15  . ED EKG  . ED  EKG  . EKG 12-Lead  . EKG 12-Lead    ASSESSMENT AND PLAN:   79 year old female with past medical history  of renal transplant, diabetes, hypertension, history of gout, atrial flutter, who presented to the hospital due to weakness, dizziness and presyncopal symptoms and noted to be significantly bradycardic.  #1 symptomatic bradycardia with A. fib with RVR intermittently-secondary to sick sinus syndrome  She is symptomatic at the time of admission with dizziness and weakness which is resolved now Scheduled for pacemaker placement on Monday 11/14 -Appreciate  cardiology consult.-Dr. paraschos   #2 atrial fibrillation with RVR with history of atrial flutter-with prolonged pauses Patient is started on metoprolol 25 by mouth every 6 hours and will give Toprol 2.5 mg IV as needed basis for RVR Cardizem drip is discontinued -Discontinued  Xarelto and Plavix will start her on Eliquis 2.5  mg by mouth twice a day after PPM placement because of abnormal renal function CHADS 2 Vasc is 4 for age, female gender and hypertension  #3 acute on chronic renal failure-this is likely ATN secondary to underlying hypotension and bradycardia. Renal ultrasound is ordered which is pending Renal function is improving with IV fluids, creatinine is better at 1.98--> 1.44 Repeat a.m. labs -Appreciate nephrology recommendations   #4 hyperkalemia-this is likely secondary to the acute on chronic renal failure. Resolved with Kayexalate  #5  history of renal transplant-continue mycophenolate, prednisone, Prograf  #6 history of COPD-no acute exacerbation. Continue Spiriva, Dulera.  #7 hypertension-continue metoprolol, hydralazine and clonidine titrate as needed  #DVT prophylaxis with heparin subcutaneous  All the records are reviewed and case discussed with ED provider. Management plans discussed with the patient, daughter at bedside and they are in agreement.      All the records are reviewed and case  discussed with Care Management/Social Workerr. Management plans discussed with the patient, family and they are in agreement.  CODE STATUS: fc  TOTAL  TIME TAKING CARE OF THIS PATIENT: 35 minutes.   POSSIBLE D/C IN 2-3 DAYS, DEPENDING ON CLINICAL CONDITION.   Ramonita Lab M.D on 03/26/2015 at 12:51 PM  Between 7am to 6pm - Pager - [phone] After 6pm go to [email or web address] - password [email or web address]  [facility]  Office  [phone]  CC: Primary care physician; Pcp Not In System

## 2015-03-26 NOTE — Progress Notes (Signed)
Pt alert & oriented. IV lopressor given x 1 due to HR153, tolerated well. Pt continued tachycardic trending 100-130 across shift. PO lopressor & clonidine given as ordered,tolerated well. Renal ultrasound completed today as ordered. Adequate urine output. Plan for pacemaker insertion tomorrow.

## 2015-03-27 ENCOUNTER — Inpatient Hospital Stay: Payer: Medicare Other | Admitting: Anesthesiology

## 2015-03-27 ENCOUNTER — Inpatient Hospital Stay: Payer: Medicare Other

## 2015-03-27 ENCOUNTER — Inpatient Hospital Stay
Admit: 2015-03-27 | Discharge: 2015-03-27 | Disposition: A | Discharge status: Home / Self Care | Payer: Medicare Other | Attending: Cardiology | Admitting: Cardiology

## 2015-03-27 ENCOUNTER — Encounter
Admission: EM | Disposition: A | Discharge status: Home / Self Care | Payer: Self-pay | Source: Home / Self Care | Attending: Specialist

## 2015-03-27 ENCOUNTER — Encounter: Payer: Self-pay | Admitting: Cardiology

## 2015-03-27 HISTORY — PX: PACEMAKER INSERTION: SHX728

## 2015-03-27 LAB — CBC
HCT: 37.2 % (ref 35.0–47.0)
HEMOGLOBIN: 12.2 g/dL (ref 12.0–16.0)
MCH: 28.4 pg (ref 26.0–34.0)
MCHC: 32.7 g/dL (ref 32.0–36.0)
MCV: 86.9 fL (ref 80.0–100.0)
PLATELETS: 251 10*3/uL (ref 150–440)
RBC: 4.29 MIL/uL (ref 3.80–5.20)
RDW: 14.6 % — ABNORMAL HIGH (ref 11.5–14.5)
WBC: 7.2 10*3/uL (ref 3.6–11.0)

## 2015-03-27 LAB — BASIC METABOLIC PANEL
ANION GAP: 6 (ref 5–15)
BUN: 39 mg/dL — ABNORMAL HIGH (ref 6–20)
CALCIUM: 8.9 mg/dL (ref 8.9–10.3)
CHLORIDE: 109 mmol/L (ref 101–111)
CO2: 25 mmol/L (ref 22–32)
Creatinine, Ser: 1.6 mg/dL — ABNORMAL HIGH (ref 0.44–1.00)
GFR, EST AFRICAN AMERICAN: 35 mL/min — AB (ref >60)
GFR, EST NON AFRICAN AMERICAN: 30 mL/min — AB (ref >60)
Glucose, Bld: 116 mg/dL — ABNORMAL HIGH (ref 65–99)
POTASSIUM: 4.5 mmol/L (ref 3.5–5.1)
SODIUM: 140 mmol/L (ref 135–145)

## 2015-03-27 LAB — GLUCOSE, CAPILLARY
Glucose-Capillary: 106 mg/dL — ABNORMAL HIGH (ref 65–99)
Glucose-Capillary: 129 mg/dL — ABNORMAL HIGH (ref 65–99)
Glucose-Capillary: 135 mg/dL — ABNORMAL HIGH (ref 65–99)
Glucose-Capillary: 125 mg/dL — ABNORMAL HIGH (ref 65–99)

## 2015-03-27 SURGERY — INSERTION, CARDIAC PACEMAKER
Anesthesia: Monitor Anesthesia Care

## 2015-03-27 MED ORDER — PROPOFOL 500 MG/50ML IV EMUL
INTRAVENOUS | Status: DC | PRN
  Administered 2015-03-27: 75 ug/kg/min via INTRAVENOUS

## 2015-03-27 MED ORDER — FENTANYL CITRATE (PF) 100 MCG/2ML IJ SOLN
INTRAMUSCULAR | Status: DC | PRN
  Administered 2015-03-27: 100 ug via INTRAVENOUS

## 2015-03-27 MED ORDER — TRAZODONE HCL 50 MG PO TABS
25.0000 mg | ORAL_TABLET | Freq: Every day | ORAL | Status: DC
  Administered 2015-03-27 – 2015-03-28 (×2): 25 mg via ORAL
  Filled 2015-03-27 (×2): qty 1

## 2015-03-27 MED ORDER — MIDAZOLAM HCL 2 MG/2ML IJ SOLN
INTRAMUSCULAR | Status: DC | PRN
  Administered 2015-03-27: 2 mg via INTRAVENOUS

## 2015-03-27 MED ORDER — SODIUM CHLORIDE 0.9 % IR SOLN
80.0000 mg | Status: DC
  Filled 2015-03-27: qty 2

## 2015-03-27 MED ORDER — DILTIAZEM HCL 60 MG PO TABS
60.0000 mg | ORAL_TABLET | Freq: Four times a day (QID) | ORAL | Status: DC
  Administered 2015-03-27 – 2015-03-29 (×6): 60 mg via ORAL
  Filled 2015-03-27 (×7): qty 1

## 2015-03-27 MED ORDER — METOPROLOL TARTRATE 1 MG/ML IV SOLN
INTRAVENOUS | Status: DC | PRN
  Administered 2015-03-27 (×2): 2.5 mg via INTRAVENOUS

## 2015-03-27 MED ORDER — ONDANSETRON HCL 4 MG/2ML IJ SOLN: 4.0000 mg | Freq: Once | INTRAMUSCULAR | Status: DC | PRN

## 2015-03-27 MED ORDER — LIDOCAINE 1 % OPTIME INJ - NO CHARGE
INTRAMUSCULAR | Status: DC | PRN
  Administered 2015-03-27: 30 mL

## 2015-03-27 MED ORDER — METOPROLOL TARTRATE 50 MG PO TABS
50.0000 mg | ORAL_TABLET | Freq: Four times a day (QID) | ORAL | Status: DC
  Administered 2015-03-27 – 2015-03-29 (×7): 50 mg via ORAL
  Filled 2015-03-27 (×8): qty 1

## 2015-03-27 MED ORDER — SODIUM CHLORIDE 0.9 % IV SOLN: INTRAVENOUS | Status: DC

## 2015-03-27 MED ORDER — SODIUM CHLORIDE 0.9 % IR SOLN
Status: DC | PRN
  Administered 2015-03-27: 200 mL

## 2015-03-27 MED ORDER — GENTAMICIN SULFATE 40 MG/ML IJ SOLN
INTRAMUSCULAR | Status: AC
  Filled 2015-03-27: qty 2

## 2015-03-27 MED ORDER — ACETAMINOPHEN 325 MG PO TABS: 325.0000 mg | ORAL_TABLET | ORAL | Status: DC | PRN

## 2015-03-27 MED ORDER — FENTANYL CITRATE (PF) 100 MCG/2ML IJ SOLN: 25.0000 ug | INTRAMUSCULAR | Status: DC | PRN

## 2015-03-27 MED ORDER — SODIUM CHLORIDE 0.9 % IJ SOLN
INTRAMUSCULAR | Status: AC
  Filled 2015-03-27: qty 50

## 2015-03-27 MED ORDER — CEFAZOLIN SODIUM 1-5 GM-% IV SOLN
1.0000 g | Freq: Four times a day (QID) | INTRAVENOUS | Status: AC
  Administered 2015-03-27 – 2015-03-28 (×3): 1 g via INTRAVENOUS
  Filled 2015-03-27 (×3): qty 50

## 2015-03-27 MED ORDER — ONDANSETRON HCL 4 MG/2ML IJ SOLN: 4.0000 mg | Freq: Four times a day (QID) | INTRAMUSCULAR | Status: DC | PRN

## 2015-03-27 MED ORDER — CEFAZOLIN SODIUM-DEXTROSE 2-3 GM-% IV SOLR
2.0000 g | INTRAVENOUS | Status: AC
  Administered 2015-03-27: 2 g via INTRAVENOUS
  Filled 2015-03-27 (×2): qty 50

## 2015-03-27 SURGICAL SUPPLY — 34 items
BAG DECANTER FOR FLEXI CONT (MISCELLANEOUS) ×3 IMPLANT
BRUSH SCRUB 4% CHG (MISCELLANEOUS) ×3 IMPLANT
CABLE SURG 12 DISP A/V CHANNEL (MISCELLANEOUS) ×3 IMPLANT
CANISTER SUCT 1200ML W/VALVE (MISCELLANEOUS) ×3 IMPLANT
CHLORAPREP W/TINT 26ML (MISCELLANEOUS) ×3 IMPLANT
COVER LIGHT HANDLE STERIS (MISCELLANEOUS) ×6 IMPLANT
COVER MAYO STAND STRL (DRAPES) ×3 IMPLANT
DRAPE C-ARM XRAY 36X54 (DRAPES) ×3 IMPLANT
DRESSING TELFA 4X3 1S ST N-ADH (GAUZE/BANDAGES/DRESSINGS) ×3 IMPLANT
DRSG TEGADERM 4X4.75 (GAUZE/BANDAGES/DRESSINGS) ×3 IMPLANT
GLOVE BIO SURGEON STRL SZ7.5 (GLOVE) ×3 IMPLANT
GLOVE BIO SURGEON STRL SZ8 (GLOVE) ×3 IMPLANT
GOWN STRL REUS W/ TWL LRG LVL3 (GOWN DISPOSABLE) ×1 IMPLANT
GOWN STRL REUS W/ TWL XL LVL3 (GOWN DISPOSABLE) ×1 IMPLANT
GOWN STRL REUS W/TWL LRG LVL3 (GOWN DISPOSABLE) ×2
GOWN STRL REUS W/TWL XL LVL3 (GOWN DISPOSABLE) ×2
IMMOBILIZER SHDR MD LX WHT (SOFTGOODS) IMPLANT
IMMOBILIZER SHDR XL LX WHT (SOFTGOODS) IMPLANT
INTRO PACEMAKR LEAD 9FR 13CM (INTRODUCER) ×3
INTRO PACEMKR SHEATH II 7FR (MISCELLANEOUS) ×3
INTRODUCER PACEMKR LD 9FR 13CM (INTRODUCER) ×1 IMPLANT
INTRODUCER PACEMKR SHTH II 7FR (MISCELLANEOUS) ×1 IMPLANT
IV NS 500ML (IV SOLUTION) ×2
IV NS 500ML BAXH (IV SOLUTION) ×1 IMPLANT
KIT RM TURNOVER STRD PROC AR (KITS) ×3 IMPLANT
LABEL OR SOLS (LABEL) ×3 IMPLANT
LEAD CAPSURE NOVUS 5076-52CM (Lead) ×3 IMPLANT
MARKER SKIN W/RULER 31145785 (MISCELLANEOUS) ×3 IMPLANT
PACK PACE INSERTION (MISCELLANEOUS) ×3 IMPLANT
PAD GROUND ADULT SPLIT (MISCELLANEOUS) ×3 IMPLANT
PAD STATPAD (MISCELLANEOUS) ×3 IMPLANT
PPM'ADVISA SR MRI A3SR01 (Pacemaker) ×1 IMPLANT
PPMADVISA SR MRI A3SR01 (Pacemaker) ×2 IMPLANT
SUT SILK 0 SH 30 (SUTURE) ×9 IMPLANT

## 2015-03-27 NOTE — Anesthesia Preprocedure Evaluation (Addendum)
Anesthesia Evaluation  Patient identified by MRN, date of birth, ID band Patient awake    Airway Mallampati: II       Dental no notable dental hx.    Pulmonary asthma ,     + decreased breath sounds      Cardiovascular hypertension, Pt. on home beta blockers + CAD and + Peripheral Vascular Disease   Rhythm:Irregular Rate:Abnormal     Neuro/Psych    GI/Hepatic negative GI ROS, Neg liver ROS,   Endo/Other  diabetes, Type 1, Insulin Dependent  Renal/GU negative Renal ROS     Musculoskeletal   Abdominal   Peds  Hematology  (+) anemia ,   Anesthesia Other Findings   Reproductive/Obstetrics                            Anesthesia Physical Anesthesia Plan  ASA: III  Anesthesia Plan: MAC   Post-op Pain Management:    Induction: Intravenous  Airway Management Planned: Nasal Cannula  Additional Equipment:   Intra-op Plan:   Post-operative Plan:   Informed Consent:   Plan Discussed with: CRNA  Anesthesia Plan Comments:         Anesthesia Quick Evaluation

## 2015-03-27 NOTE — Transfer of Care (Signed)
Immediate Anesthesia Transfer of Care Note  Patient: Wendy Key  Procedure(s) Performed: Procedure(s): INSERTION PACEMAKER (N/A)  Patient Location: PACU  Anesthesia Type:MAC  Level of Consciousness: awake, alert  and oriented  Airway & Oxygen Therapy: Patient Spontanous Breathing and Patient connected to face mask oxygen  Post-op Assessment: Report given to RN and Post -op Vital signs reviewed and stable  Post vital signs: Reviewed and stable  Last Vitals:  Filed Vitals:   03/27/15 0732  BP:   Pulse:   Temp: 36.8 C  Resp:     Complications: No apparent anesthesia complications

## 2015-03-27 NOTE — Progress Notes (Signed)
Patient to O.R. After consent signed. Family at her bedside

## 2015-03-27 NOTE — Progress Notes (Signed)
I spoke with OR staff and explained patient has not signed consent they had me ask patient if they come get her and take her to holding room and Dr. Cassie FreerParachos speak with her there will that be okay. She is agreeable.

## 2015-03-27 NOTE — Anesthesia Postprocedure Evaluation (Signed)
  Anesthesia Post-op Note  Patient: Wendy Key  Procedure(s) Performed: Procedure(s): INSERTION PACEMAKER (N/A)  Anesthesia type:MAC  Patient location: PACU  Post pain: Pain level controlled  Post assessment: Post-op Vital signs reviewed, Patient's Cardiovascular Status Stable, Respiratory Function Stable, Patent Airway and No signs of Nausea or vomiting  Post vital signs: Reviewed and stable  Last Vitals:  Filed Vitals:   03/27/15 1433  BP: 122/89  Pulse: 133  Temp:   Resp: 15    Level of consciousness: awake, alert  and patient cooperative  Complications: No apparent anesthesia complications

## 2015-03-27 NOTE — Progress Notes (Signed)
Patient says she will not sign consent for pacemaker until MD talks with her.

## 2015-03-27 NOTE — Progress Notes (Signed)
[facility]  ROUNDING NOTE   Subjective:  Cr slightly higher this AM at 1.6.  Transplant renal US was unremarkable with exception of small transplanted kidney. Overall pt feeling better.    Objective:  Vital signs in last 24 hours:  Temp:  [98.3 F (36.8 C)-98.9 F (37.2 C)] 98.7 F (37.1 C) (11/14 0400) Pulse Rate:  [30-153] 75 (11/14 0600) Resp:  [11-29] 19 (11/14 0600) BP: (100-160)/(53-101) 110/69 mmHg (11/14 0600) SpO2:  [97 %-100 %] 98 % (11/14 0600)  Weight change:  Filed Weights   03/23/15 2137 03/25/15 0400  Weight: 69.6 kg (153 lb 7 oz) 71.8 kg (158 lb 4.6 oz)    Intake/Output: I/O last 3 completed shifts: In: 723 [P.O.:720; I.V.:3] Out: 650 [Urine:650]   Intake/Output this shift:  Total I/O In: -  Out: 700 [Urine:700]  Physical Exam: General: NAD  Head: Normocephalic, atraumatic. Moist oral mucosal membranes  Eyes: Anicteric  Neck: Supple, trachea midline  Lungs:  Clear to auscultation normal effort  Heart: S1S2 no rubs  Abdomen:  Soft, nontender, BS present  Extremities:  no peripheral edema.  Neurologic: Nonfocal, moving all four extremities  Skin: No lesions       Basic Metabolic Panel:  Recent Labs Lab 03/23/15 1833 03/24/15 0433 03/25/15 0619 03/26/15 0517 03/27/15 0440  NA 135 136 142 142 140  K 5.6* 5.5* 4.5 4.2 4.5  CL 104 107 112* 112* 109  CO2 19* 17* 22 21* 25  GLUCOSE 271* 124* 101* 117* 116*  BUN 72* 78* 52* 36* 39*  CREATININE 3.98* 3.72* 1.98* 1.44* 1.60*  CALCIUM 9.0 8.5* 8.6* 9.5 8.9    Liver Function Tests: No results for input(s): AST, ALT, ALKPHOS, BILITOT, PROT, ALBUMIN in the last 168 hours. No results for input(s): LIPASE, AMYLASE in the last 168 hours. No results for input(s): AMMONIA in the last 168 hours.  CBC:  Recent Labs Lab 03/23/15 1833 03/24/15 0433 03/25/15 0619 03/26/15 0517 03/27/15 0440  WBC 9.4 9.8 7.8 10.6 7.2  HGB 12.6 11.2* 12.0 12.9 12.2  HCT 38.9 34.9* 37.5 41.3 37.2   MCV 88.3 86.0 85.9 86.6 86.9  PLT 260 244 258 316 251    Cardiac Enzymes:  Recent Labs Lab 03/23/15 1833  TROPONINI 0.04*    BNP: Invalid input(s): POCBNP  CBG:  Recent Labs Lab 03/25/15 2152 03/26/15 0650 03/26/15 1113 03/26/15 1701 03/26/15 2134  GLUCAP 132* 115* 163* 156* 154*    Microbiology: Results for orders placed or performed during the hospital encounter of 03/23/15  MRSA PCR Screening     Status: None   Collection Time: 03/23/15  9:30 PM  Result Value Ref Range Status   MRSA by PCR NEGATIVE NEGATIVE Final    Comment:        The GeneXpert MRSA Assay (FDA approved for NASAL specimens only), is one component of a comprehensive MRSA colonization surveillance program. It is not intended to diagnose MRSA infection nor to guide or monitor treatment for MRSA infections.     Coagulation Studies: No results for input(s): LABPROT, INR in the last 72 hours.  Urinalysis: No results for input(s): COLORURINE, LABSPEC, PHURINE, GLUCOSEU, HGBUR, BILIRUBINUR, KETONESUR, PROTEINUR, UROBILINOGEN, NITRITE, LEUKOCYTESUR in the last 72 hours.  Invalid input(s): APPERANCEUR    Imaging: US Renal  03/26/2015  CLINICAL DATA:  79 year old female with acute renal failure. EXAM: RENAL / URINARY TRACT ULTRASOUND COMPLETE COMPARISON:  None. FINDINGS: Right Native Kidney: Length: 10.8 cm. Diffusely increased echogenicity, cortical atrophy and multiple  cysts identified. No definite hydronephrosis or solid mass. Left Native Kidney: Length: 11.6 cm. Diffusely increased echogenicity, cortical atrophy and multiple cysts identified. No definite hydronephrosis or solid mass. Transplant kidney: Length:  8.6 cm.  No solid mass or hydronephrosis. Bladder: Appears normal for degree of bladder distention. IMPRESSION: Slightly small transplant kidney without other significant abnormality. Chronic medical renal disease of both native kidneys with multiple cysts. No evidence of hydronephrosis  or definite solid mass. Electronically Signed   By: [name]  Hu M.D.   On: 03/26/2015 10:42     Medications:   . sodium chloride 100 mL/hr at 03/24/15 2127  . sodium chloride     . calcium-vitamin D  1 tablet Oral Daily  .  ceFAZolin (ANCEF) IV  2 g Intravenous On Call  . cloNIDine  0.1 mg Oral TID  . gentamicin irrigation  80 mg Irrigation On Call  . heparin  5,000 Units Subcutaneous 3 times per day  . insulin aspart  0-5 Units Subcutaneous QHS  . insulin aspart  0-9 Units Subcutaneous TID WC  . metoprolol tartrate  25 mg Oral QID  . mometasone-formoterol  2 puff Inhalation BID  . mycophenolate  360 mg Oral BID  . pravastatin  40 mg Oral Daily  . predniSONE  5 mg Oral Q breakfast  . sodium chloride  3 mL Intravenous Q12H  . tacrolimus  2 mg Oral BID  . tiotropium  18 mcg Inhalation Daily   acetaminophen **OR** acetaminophen, hydrALAZINE, metoprolol, ondansetron **OR** ondansetron (ZOFRAN) IV, promethazine  Assessment/ Plan:  79 y.o. female with a PMHx of cada[facility] renal [facility], hypertension, hyperlipidemia, diabetes mellitus type 2, coronary artery disease, asthma, peripheral vascular disease, gout, who [facility] for evaluation of dizziness and symptomatically bradycardia.   1. Acute renal failure due to ATN (N17.0)/s/p renal transplantation status/baseline Cr 1.4-1.6: The patient presented with bradycardia which was symptomatic. This likely resulted in acute renal failure from acute tubular necrosis. -Cr slightly higher this AM, but still within her prior Cr range.  Continue hydration for now.  Continue current doses of tacrolimus, myfortic, and prednisone, tacrolimus level pending.  2. Bradycardia. Patient due for pacemaker placement today.  3. Hyperkalemia. Potassium still within the normal range at 4.5. Continue to monitor.   LOS: 4 Wendy Key 11/14/20166:58 AM

## 2015-03-27 NOTE — Progress Notes (Signed)
Patient returned from OR. She is awake and alert. She has a tegaderm and telfa pad covering her incision and a left arm immobilizer on.

## 2015-03-27 NOTE — Progress Notes (Signed)
Paged and spoke to Dr. Darrold JunkerParaschos regarding patient's holding AM dose of metoprolol.  Per MD, go ahead and give dose.  Also informed Dr. Darrold JunkerParaschos about consent not having been signed because patient had some questions.  Dr. Darrold JunkerParaschos would like rounding hospitalist to please address any questions this morning to prevent delay in procedure.  This RN will relay message to oncoming staff.

## 2015-03-27 NOTE — Op Note (Signed)
[facility]   03/23/2015 - 03/27/2015                     1:59 PM  PATIENT:  Wendy SatoPearlie B Key    PRE-OPERATIVE DIAGNOSIS:  H  POST-OPERATIVE DIAGNOSIS:  Same  PROCEDURE:  INSERTION PACEMAKER  SURGEON:  Gleason Ardoin, MD    ANESTHESIA:     PREOPERATIVE INDICATIONS:  Wendy Key is a  79 y.o. female with a diagnosis of H who failed conservative measures and elected for surgical management.    The risks benefits and alternatives were discussed with the patient preoperatively including but not limited to the risks of infection, bleeding, cardiopulmonary complications, the need for revision surgery, among others, and the patient was willing to proceed.   OPERATIVE PROCEDURE: The patient was brought the operating room in fasting state. The left pectoral region was prepped and draped in usual sterile manner. Anesthesia was obtained 1% lidocaine locally. A 6 cm incision was performed over the left pectoral region. The pacemaker pocket was generated by electrocautery and blunt dissection. Access was obtained to left subclavian vein by fine-needle aspiration. An MRI compatible lead was positioned to the right ventricular apical septum. After proper thresholds were obtained from the lead was sutured in place. The pacemaker pocket was irrigated with gentamicin solution. The lead was  connected to an MRI compatible rate responsive pacemaker generator ( [id number] [id number] ). The pocket was closed with 2-0 and 4-0 Vicryl, respectively. Steri-Strips and a pressure dressing were applied.

## 2015-03-28 LAB — RENAL FUNCTION PANEL
ALBUMIN: 3.6 g/dL (ref 3.5–5.0)
ANION GAP: 10 (ref 5–15)
BUN: 34 mg/dL — AB (ref 6–20)
CALCIUM: 9.4 mg/dL (ref 8.9–10.3)
CO2: 22 mmol/L (ref 22–32)
Chloride: 105 mmol/L (ref 101–111)
Creatinine, Ser: 1.56 mg/dL — ABNORMAL HIGH (ref 0.44–1.00)
GFR calc Af Amer: 36 mL/min — ABNORMAL LOW (ref >60)
GFR, EST NON AFRICAN AMERICAN: 31 mL/min — AB (ref >60)
Glucose, Bld: 122 mg/dL — ABNORMAL HIGH (ref 65–99)
PHOSPHORUS: 2.9 mg/dL (ref 2.5–4.6)
POTASSIUM: 4 mmol/L (ref 3.5–5.1)
Sodium: 137 mmol/L (ref 135–145)

## 2015-03-28 LAB — GLUCOSE, CAPILLARY
GLUCOSE-CAPILLARY: 134 mg/dL — AB (ref 65–99)
GLUCOSE-CAPILLARY: 174 mg/dL — AB (ref 65–99)
Glucose-Capillary: 159 mg/dL — ABNORMAL HIGH (ref 65–99)
Glucose-Capillary: 135 mg/dL — ABNORMAL HIGH (ref 65–99)

## 2015-03-28 LAB — TACROLIMUS LEVEL: TACROLIMUS (FK506) - LABCORP: 7.8 ng/mL (ref 2.0–20.0)

## 2015-03-28 MED ORDER — SODIUM CHLORIDE 0.9 % IJ SOLN: 3.0000 mL | INTRAMUSCULAR | Status: DC | PRN

## 2015-03-28 MED ORDER — ALPRAZOLAM 0.25 MG PO TABS
0.2500 mg | ORAL_TABLET | Freq: Three times a day (TID) | ORAL | Status: DC | PRN
  Administered 2015-03-28: 0.25 mg via ORAL
  Filled 2015-03-28: qty 1

## 2015-03-28 NOTE — Care Management Note (Signed)
Case Management Note  Patient Details  Name: Wendy Key MRN: [id number] Date of Birth: 17-Jun-1935  Subjective/Objective:    Met with patient at bedside for CM assessment. She states she lives at home with her daughter. Prior to admission she was independent and required no DME. She does not drive. No home health. Denies issues obtaining medications, copays or medical care. Her PCP and nephrologist is at [facility].                 Action/Plan: No anticipated discharge needs. Would benefit from PT evaluation.   Expected Discharge Date:                  Expected Discharge Plan:  Home/Self Care  In-House Referral:     Discharge planning Services  CM Consult  Post Acute Care Choice:    Choice offered to:     DME Arranged:    DME Agency:     HH Arranged:    HH Agency:     Status of Service:  In process, will continue to follow  Medicare Important Message Given:    Date Medicare IM Given:    Medicare IM give by:    Date Additional Medicare IM Given:    Additional Medicare Important Message give by:     If discussed at [place] of Stay Meetings, dates discussed:    Additional Comments:  Jolly Mango, RN 03/28/2015, 10:48 AM

## 2015-03-28 NOTE — Progress Notes (Signed)
Pt rested on and off overnight. Remains in a flutter rate now in 80s. BP initially low and meds were held. Now trending upward morning BP meds given. VSS no problems.

## 2015-03-28 NOTE — Care Management Note (Signed)
Case Management Note  Patient Details  Name: Wendy Key MRN: [id number] Date of Birth: 1935/08/26  Subjective/Objective:    Pacemaker placed 11/14                Action/Plan:   Expected Discharge Date:                  Expected Discharge Plan:  Home/Self Care  In-House Referral:     Discharge planning Services  CM Consult  Post Acute Care Choice:    Choice offered to:     DME Arranged:    DME Agency:     HH Arranged:    HH Agency:     Status of Service:  In process, will continue to follow  Medicare Important Message Given:    Date Medicare IM Given:    Medicare IM give by:    Date Additional Medicare IM Given:    Additional Medicare Important Message give by:     If discussed at Long Length of Stay Meetings, dates discussed:    Additional Comments:  Marily MemosLisa M [name], RN 03/28/2015, 8:46 AM

## 2015-03-28 NOTE — Progress Notes (Signed)
[facility] - [place] [facility]   PATIENT NAME: Wendy Key    MR#:  [id number]  DATE OF BIRTH:  Oct 30, 1935  SUBJECTIVE:  CHIEF COMPLAINT:  Patient is resting comfortably. Heart rate is well controlled today. Status post pacemaker placement , seen after the procedure.   REVIEW OF SYSTEMS:  CONSTITUTIONAL: No fever,positive for  fatigue or weakness.  EYES: No blurred or double vision.  EARS, NOSE, AND THROAT: No tinnitus or ear pain.  RESPIRATORY: No cough, shortness of breath, wheezing or hemoptysis.  CARDIOVASCULAR:denies any dizziness , palpitations.No chest pain, orthopnea, edema.  GASTROINTESTINAL: No nausea, vomiting, diarrhea or abdominal pain.  GENITOURINARY: No dysuria, hematuria.  ENDOCRINE: No polyuria, nocturia,  HEMATOLOGY: No anemia, easy bruising or bleeding SKIN: No rash or lesion. MUSCULOSKELETAL: No joint pain or arthritis.   NEUROLOGIC: No tingling, numbness,  generalized weakness.  PSYCHIATRY: No anxiety or depression.   DRUG ALLERGIES:  No Known Allergies  VITALS:  Blood pressure 126/71, pulse 63, temperature 98.7 F (37.1 C), temperature source Oral, resp. rate 24, height  (1.676 m), weight 71.8 kg (158 lb 4.6 oz), SpO2 97 %.  PHYSICAL EXAMINATION:  GENERAL:  79 y.o.-year-old patient lying in the bed with no acute distress.  EYES: Pupils equal, round, reactive to light and accommodation. No scleral icterus. Extraocular muscles intact.  HEENT: Head atraumatic, normocephalic. Oropharynx and nasopharynx clear.  NECK:  Supple, no jugular venous distention. No thyroid enlargement, no tenderness.  LUNGS: Normal breath sounds bilaterally, no wheezing, rales,rhonchi or crepitation. No use of accessory muscles of respiration.  CARDIOVASCULAR: Irregularly irregular. No murmurs. Pacemaker in place  ABDOMEN: Soft, nontender, nondistended. Bowel sounds present. No organomegaly or mass.  EXTREMITIES: No pedal edema, cyanosis, or  clubbing.  NEUROLOGIC: Cranial nerves II through XII are intact. Muscle strength 4/5 in all extremities. Sensation intact. Gait not checked.  PSYCHIATRIC: The patient is alert and oriented x 3.  SKIN: No obvious rash, lesion, or ulcer.    LABORATORY PANEL:   CBC  Recent Labs Lab 03/27/15 0440  WBC 7.2  HGB 12.2  HCT 37.2  PLT 251   ------------------------------------------------------------------------------------------------------------------  Chemistries   Recent Labs Lab 03/28/15 0802  NA 137  K 4.0  CL 105  CO2 22  GLUCOSE 122*  BUN 34*  CREATININE 1.56*  CALCIUM 9.4   ------------------------------------------------------------------------------------------------------------------  Cardiac Enzymes  Recent Labs Lab 03/23/15 1833  TROPONINI 0.04*   ------------------------------------------------------------------------------------------------------------------  RADIOLOGY:  Dg Chest Port 1 View  03/27/2015  CLINICAL DATA:  Pacemaker insertion. FLUOROSCOPY TIME:  C-arm fluoroscopic images were obtained intraoperatively and submitted for post operative interpretation. Please see the performing provider's procedural report for the fluoroscopy time utilized. EXAM: PORTABLE CHEST 1 VIEW, C-arm 61-120 minutes. COMPARISON:  Chest x-ray dated 03/17/2015 FINDINGS: Single lead pacemaker is been inserted. No pneumothorax. Heart size and vascularity are normal and the lungs are clear. No acute osseous abnormality. Severe degenerative changes at the right shoulder. Aortic atherosclerosis. IMPRESSION: Satisfactory appearance of the chest after pacemaker insertion. Aortic atherosclerosis. Electronically Signed   By: Francene Boyers M.D.   On: 03/27/2015 14:47   Dg C-arm 61-120 Min-no Report  03/27/2015  CLINICAL DATA: port placmennt, pacemaker C-ARM 61-120 MINUTES Fluoroscopy was utilized by the requesting physician.  No radiographic interpretation.    ASSESSMENT AND PLAN:    79 year old female with past medical history of renal transplant, diabetes, hypertension, history of gout, atrial flutter, who presented to the hospital due to weakness, dizziness and presyncopal symptoms  and noted to be significantly bradycardic.  #1 symptomatic bradycardia with A. fib with RVR intermittently-secondary to sick sinus syndrome  She is symptomatic at the time of admission with dizziness and weakness -Appreciate  cardiology consult.-Dr. paraschos  - Status post pacemaker placement 03/27/15 - Dr Darrold Junkerparaschos suggested to continue keflex for 7 days after PPM placement.  #2 atrial fibrillation with RVR with history of atrial flutter-with prolonged pauses on metoprolol oral and Toprol 2.5 mg IV as needed basis for RVR Cardizem drip is discontinued, HR under control for now. -Discontinued  Xarelto and Plavix will start her on Eliquis 2.5  mg by mouth twice a day- once renal function further improves. CHADS 2 Vasc is 4 for age, female gender and hypertension  #3 acute on chronic renal failure-this is likely ATN secondary to underlying hypotension and bradycardia. Renal function is improving with IV fluids, creatinine is better  Repeat a.m. labs -Appreciate nephrology recommendations - avoid nephrotoxics   #4 hyperkalemia-this is likely secondary to the acute on chronic renal failure. Resolved with Kayexalate  #5  history of renal transplant-continue mycophenolate, prednisone, Prograf  #6 history of COPD-no acute exacerbation. Continue Spiriva, Dulera.  #7 hypertension-continue metoprolol, hydralazine and clonidine titrate as needed  #DVT prophylaxis with heparin subcutaneous  All the records are reviewed and case discussed with ED provider. Management plans discussed with the patient, daughter at bedside and they are in agreement.   CODE STATUS: Full  TOTAL  TIME TAKING CARE OF THIS PATIENT: 35 minutes.   POSSIBLE D/C IN 1-2 DAYS, DEPENDING ON CLINICAL  CONDITION.   Altamese DillingVACHHANI, Sahra Converse M.D on 03/28/2015 at 6:10 PM  Between 7am to 6pm - Pager - [phone] After 6pm go to [email or web address] - password [email or web address]  [facility]  Office  [phone]  CC: Primary care physician; Pcp Not In System

## 2015-03-28 NOTE — Progress Notes (Signed)
Mrs. Wendy Key is alert and oriented. No c/o pain. Mood pleasant. HR went up to 150's this morning and was feeling anxious. Pt given ordered metoporolol and xanax. Pt got up to walk twice today in hallway. Currently has orders to transfer to telemetry.

## 2015-03-28 NOTE — Clinical Documentation Improvement (Signed)
Nephrology/Renal  Can the diagnosis of CKD be further specified?   CKD Stage I - GFR greater than or equal to 90  CKD Stage II - GFR 60-89  CKD Stage III - GFR 30-59  CKD Stage IV - GFR 15-29  CKD Stage V - GFR < 15  ESRD (End Stage Renal Disease)  Other condition  Unable to clinically determine   Supporting Information: : (risk factors, signs and symptoms, diagnostics, treatment) BUN - 72, 78, 52, 36, 39; Creatinine - 3.98, 3.72, 1.98, 1.44, 1.6; GFR 10, 11, 23, 34, 39, 34.  Pt has had a kidney transplant  Please exercise your independent, professional judgment when responding. A specific answer is not anticipated or expected.   Thank Modesta MessingYou, Aqsa Sensabaugh L [facility] Information Management [place] [phone]

## 2015-03-28 NOTE — Progress Notes (Signed)
[facility]  ROUNDING NOTE   Subjective:  Renal function still pending this a.m. Pacemaker was placed yesterday. Still was tachycardic at times during the night.    Objective:  Vital signs in last 24 hours:  Temp:  [97.6 F (36.4 C)-98.8 F (37.1 C)] 98.7 F (37.1 C) (11/15 0200) Pulse Rate:  [51-134] 60 (11/15 0600) Resp:  [15-22] 20 (11/15 0600) BP: (72-168)/(31-101) 168/101 mmHg (11/15 0600) SpO2:  [96 %-100 %] 96 % (11/15 0600)  Weight change:  Filed Weights   03/23/15 2137 03/25/15 0400  Weight: 69.6 kg (153 lb 7 oz) 71.8 kg (158 lb 4.6 oz)    Intake/Output: I/O last 3 completed shifts: In: 918 [P.O.:315; I.V.:603] Out: 910 [Urine:900; Blood:10]   Intake/Output this shift:  Total I/O In: 150 [IV Piggyback:150] Out: 700 [Urine:700]  Physical Exam: General: NAD  Head: Normocephalic, atraumatic. Moist oral mucosal membranes  Eyes: Anicteric  Neck: Supple, trachea midline  Lungs:  Clear to auscultation normal effort  Heart: S1S2 irregular  Abdomen:  Soft, nontender, BS present  Extremities:  no peripheral edema.  Neurologic: Nonfocal, moving all four extremities  Skin: No lesions       Basic Metabolic Panel:  Recent Labs Lab 03/23/15 1833 03/24/15 0433 03/25/15 0619 03/26/15 0517 03/27/15 0440  NA 135 136 142 142 140  K 5.6* 5.5* 4.5 4.2 4.5  CL 104 107 112* 112* 109  CO2 19* 17* 22 21* 25  GLUCOSE 271* 124* 101* 117* 116*  BUN 72* 78* 52* 36* 39*  CREATININE 3.98* 3.72* 1.98* 1.44* 1.60*  CALCIUM 9.0 8.5* 8.6* 9.5 8.9    Liver Function Tests: No results for input(s): AST, ALT, ALKPHOS, BILITOT, PROT, ALBUMIN in the last 168 hours. No results for input(s): LIPASE, AMYLASE in the last 168 hours. No results for input(s): AMMONIA in the last 168 hours.  CBC:  Recent Labs Lab 03/23/15 1833 03/24/15 0433 03/25/15 0619 03/26/15 0517 03/27/15 0440  WBC 9.4 9.8 7.8 10.6 7.2  HGB 12.6 11.2* 12.0 12.9 12.2  HCT 38.9 34.9* 37.5  41.3 37.2  MCV 88.3 86.0 85.9 86.6 86.9  PLT 260 244 258 316 251    Cardiac Enzymes:  Recent Labs Lab 03/23/15 1833  TROPONINI 0.04*    BNP: Invalid input(s): POCBNP  CBG:  Recent Labs Lab 03/26/15 2134 03/27/15 0711 03/27/15 1129 03/27/15 1549 03/27/15 2142  GLUCAP 154* 106* 129* 135* 125*    Microbiology: Results for orders placed or performed during the hospital encounter of 03/23/15  MRSA PCR Screening     Status: None   Collection Time: 03/23/15  9:30 PM  Result Value Ref Range Status   MRSA by PCR NEGATIVE NEGATIVE Final    Comment:        The GeneXpert MRSA Assay (FDA approved for NASAL specimens only), is one component of a comprehensive MRSA colonization surveillance program. It is not intended to diagnose MRSA infection nor to guide or monitor treatment for MRSA infections.     Coagulation Studies: No results for input(s): LABPROT, INR in the last 72 hours.  Urinalysis: No results for input(s): COLORURINE, LABSPEC, PHURINE, GLUCOSEU, HGBUR, BILIRUBINUR, KETONESUR, PROTEINUR, UROBILINOGEN, NITRITE, LEUKOCYTESUR in the last 72 hours.  Invalid input(s): APPERANCEUR    Imaging: [place]as Renal  03/26/2015  CLINICAL DATA:  79 year old female with acute renal failure. EXAM: RENAL / URINARY TRACT ULTRASOUND COMPLETE COMPARISON:  None. FINDINGS: Right Native Kidney: Length: 10.8 cm. Diffusely increased echogenicity, cortical atrophy and multiple cysts identified. No definite  hydronephrosis or solid mass. Left Native Kidney: Length: 11.6 cm. Diffusely increased echogenicity, cortical atrophy and multiple cysts identified. No definite hydronephrosis or solid mass. Transplant kidney: Length:  8.6 cm.  No solid mass or hydronephrosis. Bladder: Appears normal for degree of bladder distention. IMPRESSION: Slightly small transplant kidney without other significant abnormality. Chronic medical renal disease of both native kidneys with multiple cysts. No evidence of  hydronephrosis or definite solid mass. Electronically Signed   By: Harmon Pier M.D.   On: 03/26/2015 10:42   Dg Chest Port 1 View  03/27/2015  CLINICAL DATA:  Pacemaker insertion. FLUOROSCOPY TIME:  C-arm fluoroscopic images were obtained intraoperatively and submitted for post operative interpretation. Please see the performing provider's procedural report for the fluoroscopy time utilized. EXAM: PORTABLE CHEST 1 VIEW, C-arm 61-120 minutes. COMPARISON:  Chest x-ray dated 03/17/2015 FINDINGS: Single lead pacemaker is been inserted. No pneumothorax. Heart size and vascularity are normal and the lungs are clear. No acute osseous abnormality. Severe degenerative changes at the right shoulder. Aortic atherosclerosis. IMPRESSION: Satisfactory appearance of the chest after pacemaker insertion. Aortic atherosclerosis. Electronically Signed   By: Francene Boyers M.D.   On: 03/27/2015 14:47   Dg C-arm 61-120 Min-no Report  03/27/2015  CLINICAL DATA: port placmennt, pacemaker C-ARM 61-120 MINUTES Fluoroscopy was utilized by the requesting physician.  No radiographic interpretation.     Medications:   . sodium chloride 100 mL/hr at 03/24/15 2127  . sodium chloride     . calcium-vitamin D  1 tablet Oral Daily  . cloNIDine  0.1 mg Oral TID  . diltiazem  60 mg Oral 4 times per day  . heparin  5,000 Units Subcutaneous 3 times per day  . insulin aspart  0-5 Units Subcutaneous QHS  . insulin aspart  0-9 Units Subcutaneous TID WC  . metoprolol tartrate  50 mg Oral QID  . mometasone-formoterol  2 puff Inhalation BID  . mycophenolate  360 mg Oral BID  . pravastatin  40 mg Oral Daily  . predniSONE  5 mg Oral Q breakfast  . sodium chloride  3 mL Intravenous Q12H  . tacrolimus  2 mg Oral BID  . tiotropium  18 mcg Inhalation Daily  . traZODone  25 mg Oral QHS   acetaminophen **OR** acetaminophen, acetaminophen, fentaNYL (SUBLIMAZE) injection, hydrALAZINE, metoprolol, ondansetron **OR** ondansetron (ZOFRAN)  IV, promethazine  Assessment/ Plan:  79 y.o. female with a PMHx of cadaveric renal transplantation 2005 at [facility], hypertension, hyperlipidemia, diabetes mellitus type 2, coronary artery disease, asthma, peripheral vascular disease, gout, who was admitted to [facility] on 03/23/2015 for evaluation of dizziness and symptomatically bradycardia.   1. Acute renal failure due to ATN (N17.0)/s/p renal transplantation status/baseline Cr 1.4-1.6: The patient presented with bradycardia which was symptomatic. This likely resulted in acute renal failure from acute tubular necrosis. -Cr still pending this AM, tacrolimus level also still pending.  Continue supportive care for now.  Continue current doses of tacrolimus, myfortic, and prednisone.   2. Bradycardia. S/p pacemaker placement 03/27/15, still has irregular rhythm.   3. Hyperkalemia. Awaiting new K this AM.   LOS: 5 Wendy Key 11/15/20167:00 AM

## 2015-03-28 NOTE — Progress Notes (Signed)
Patient transferred from CCU, oriented to room, fall, and safety contract reviewed. Focused assessment as charted. No complaints at this time. Skin verified with Norva Karvonenheryl Greene, RN. Telemetry box verified. Trudee KusterBrandi R Mansfield

## 2015-03-28 NOTE — Progress Notes (Signed)
[facility] [place] at [facility]   PATIENT NAME: Wendy Key    MR#:  [id number]  DATE OF BIRTH:  1935-09-26  SUBJECTIVE:  CHIEF COMPLAINT:  Patient is resting comfortably. Heart rate is well controlled today. Status post pacemaker placement , and today morning had episode of tachycardia up to 150-1 60 bpm again- which resolved after receiving metoprolol injection.  REVIEW OF SYSTEMS:  CONSTITUTIONAL: No fever,positive for  fatigue or weakness.  EYES: No blurred or double vision.  EARS, NOSE, AND THROAT: No tinnitus or ear pain.  RESPIRATORY: No cough, shortness of breath, wheezing or hemoptysis.  CARDIOVASCULAR:denies any dizziness , had palpitation earlier in the morning.No chest pain, orthopnea, edema.  GASTROINTESTINAL: No nausea, vomiting, diarrhea or abdominal pain.  GENITOURINARY: No dysuria, hematuria.  ENDOCRINE: No polyuria, nocturia,  HEMATOLOGY: No anemia, easy bruising or bleeding SKIN: No rash or lesion. MUSCULOSKELETAL: No joint pain or arthritis.   NEUROLOGIC: No tingling, numbness,  generalized weakness.  PSYCHIATRY: No anxiety or depression.   DRUG ALLERGIES:  No Known Allergies  VITALS:  Blood pressure 126/71, pulse 63, temperature 98.7 F (37.1 C), temperature source Oral, resp. rate 24, height 5\' 6"  (1.676 m), weight 71.8 kg (158 lb 4.6 oz), SpO2 97 %.  PHYSICAL EXAMINATION:  GENERAL:  79 y.o.-year-old patient lying in the bed with no acute distress.  EYES: Pupils equal, round, reactive to light and accommodation. No scleral icterus. Extraocular muscles intact.  HEENT: Head atraumatic, normocephalic. Oropharynx and nasopharynx clear.  NECK:  Supple, no jugular venous distention. No thyroid enlargement, no tenderness.  LUNGS: Normal breath sounds bilaterally, no wheezing, rales,rhonchi or crepitation. No use of accessory muscles of respiration.  CARDIOVASCULAR: Irregularly irregular. No murmurs. Pacemaker in place  ABDOMEN:  Soft, nontender, nondistended. Bowel sounds present. No organomegaly or mass.  EXTREMITIES: No pedal edema, cyanosis, or clubbing.  NEUROLOGIC: Cranial nerves II through XII are intact. Muscle strength 4/5 in all extremities. Sensation intact. Gait not checked.  PSYCHIATRIC: The patient is alert and oriented x 3.  SKIN: No obvious rash, lesion, or ulcer.    LABORATORY PANEL:   CBC  Recent Labs Lab 03/27/15 0440  WBC 7.2  HGB 12.2  HCT 37.2  PLT 251   ------------------------------------------------------------------------------------------------------------------  Chemistries   Recent Labs Lab 03/28/15 0802  NA 137  K 4.0  CL 105  CO2 22  GLUCOSE 122*  BUN 34*  CREATININE 1.56*  CALCIUM 9.4   ------------------------------------------------------------------------------------------------------------------  Cardiac Enzymes  Recent Labs Lab 03/23/15 1833  TROPONINI 0.04*   ------------------------------------------------------------------------------------------------------------------  RADIOLOGY:  Dg Chest Port 1 View  03/27/2015  CLINICAL DATA:  Pacemaker insertion. FLUOROSCOPY TIME:  C-arm fluoroscopic images were obtained intraoperatively and submitted for post operative interpretation. Please see the performing provider's procedural report for the fluoroscopy time utilized. EXAM: PORTABLE CHEST 1 VIEW, C-arm 61-120 minutes. COMPARISON:  Chest x-ray dated 03/17/2015 FINDINGS: Single lead pacemaker is been inserted. No pneumothorax. Heart size and vascularity are normal and the lungs are clear. No acute osseous abnormality. Severe degenerative changes at the right shoulder. Aortic atherosclerosis. IMPRESSION: Satisfactory appearance of the chest after pacemaker insertion. Aortic atherosclerosis. Electronically Signed   By: Francene BoyersJames  Maxwell M.D.   On: 03/27/2015 14:47   Dg C-arm 61-120 Min-no Report  03/27/2015  CLINICAL DATA: port placmennt, pacemaker C-ARM 61-120  MINUTES Fluoroscopy was utilized by the requesting physician.  No radiographic interpretation.    ASSESSMENT AND PLAN:   79 year old female with past medical history of renal transplant,  diabetes, hypertension, history of gout, atrial flutter, who presented to the hospital due to weakness, dizziness and presyncopal symptoms and noted to be significantly bradycardic.  #1 symptomatic bradycardia with A. fib with RVR intermittently-secondary to sick sinus syndrome  She is symptomatic at the time of admission with dizziness and weakness -Appreciate  cardiology consult.-Dr. paraschos  - Status post pacemaker placement 03/27/15 - Dr Darrold Junker suggested to continue keflex for 7 days after PPM placement.  #2 atrial fibrillation with RVR with history of atrial flutter-with prolonged pauses on metoprolol oral and Toprol 2.5 mg IV as needed basis for RVR Cardizem drip is discontinued, HR under control for now. -Discontinued  Xarelto and Plavix will start her on Eliquis 2.5  mg by mouth twice a day- once renal function further improves. CHADS 2 Vasc is 4 for age, female gender and hypertension - Again had tachycardia today morning, so we will transfer to telemetry and monitor 1 more night.   Spoke to Dr. Darrold Junker- increased dose of metoprolol and Cardizem.  #3 acute on chronic renal failure-this is likely ATN secondary to underlying hypotension and bradycardia. Renal function is improving with IV fluids, creatinine is better  Repeat a.m. labs -Appreciate nephrology recommendations - avoid nephrotoxics   #4 hyperkalemia-this is likely secondary to the acute on chronic renal failure. Resolved with Kayexalate  #5  history of renal transplant-continue mycophenolate, prednisone, Prograf  #6 history of COPD-no acute exacerbation. Continue Spiriva, Dulera.  #7 hypertension-continue metoprolol, hydralazine and clonidine titrate as needed  #DVT prophylaxis with heparin subcutaneous  #8 generalized  weakness- ambulate with nurse.  All the records are reviewed and case discussed with ED provider. Management plans discussed with the patient, daughter at bedside and they are in agreement.   CODE STATUS: Full  TOTAL  TIME TAKING CARE OF THIS PATIENT: 35 minutes.  Her daughter was present in the room, findings and plan discussed with her.  POSSIBLE D/C IN 1-2 DAYS, DEPENDING ON CLINICAL CONDITION, and if her heart rate remains under control.   Altamese Dilling M.D on 03/28/2015 at 6:19 PM  Between 7am to 6pm - Pager - [phone] After 6pm go to [email or web address] - password [email or web address]  [facility]  Office  [phone]  CC: Primary care physician; Pcp Not In System

## 2015-03-29 LAB — GLUCOSE, CAPILLARY
GLUCOSE-CAPILLARY: 120 mg/dL — AB (ref 65–99)
Glucose-Capillary: 153 mg/dL — ABNORMAL HIGH (ref 65–99)

## 2015-03-29 LAB — BASIC METABOLIC PANEL
Anion gap: 6 (ref 5–15)
BUN: 37 mg/dL — ABNORMAL HIGH (ref 6–20)
CALCIUM: 9 mg/dL (ref 8.9–10.3)
CO2: 22 mmol/L (ref 22–32)
CREATININE: 1.66 mg/dL — AB (ref 0.44–1.00)
Chloride: 108 mmol/L (ref 101–111)
GFR calc non Af Amer: 29 mL/min — ABNORMAL LOW (ref >60)
GFR, EST AFRICAN AMERICAN: 33 mL/min — AB (ref >60)
Glucose, Bld: 121 mg/dL — ABNORMAL HIGH (ref 65–99)
Potassium: 4.3 mmol/L (ref 3.5–5.1)
SODIUM: 136 mmol/L (ref 135–145)

## 2015-03-29 MED ORDER — CEPHALEXIN 500 MG PO CAPS: 500.0000 mg | ORAL_CAPSULE | Freq: Four times a day (QID) | ORAL | Status: AC

## 2015-03-29 NOTE — Evaluation (Signed)
Physical Therapy Evaluation Patient Details Name: Wendy Key MRN: [id number] DOB: 02-19-1936 Today's Date: 03/29/2015   History of Present Illness  Pt is a 79 yo female who was admitted to the hospital after experiencing dizziness, weakness, and presyncopal symptoms. Pt found to be bradychardic; pacemaker placed 03/27/15   Clinical Impression  Pt presents with hx of HTN, DM, CAD, PVD, gout, and hx of kidney transplant. Examination reveals that pt performs bed mobility at supervision, [facility], and [facility]. Pt's primary physical deficits appear to be a decrease in activity tolerance, although her current functional strength is fair. During ambulation pt HR goes into 140s (reference gait section for details) but she is non-symptomatic and reports "feeling good" during mobility. Due to her activity tolerance deficits she will continue to benefit from skilled PT in order to return to optimal PLOF.     Follow Up Recommendations Home health PT;Supervision - Intermittent    Equipment Recommendations   (Rollator )    Recommendations for Other Services       Precautions / Restrictions Precautions Precautions: Fall;ICD/Pacemaker Restrictions Weight Bearing Restrictions: No      Mobility  Bed Mobility Overal bed mobility: Needs Assistance Bed Mobility: Supine to Sit     Supine to sit: Supervision     General bed mobility comments: Pt able to perform bed mobility with only cues for use of hands.   Transfers Overall transfer level: Needs assistance Equipment used: Rolling walker (2 wheeled) Transfers: Sit to/from Stand Sit to Stand: Min guard         General transfer comment: Pt transfers with cues for hand placement. Pt strong and stable with transfer  Ambulation/Gait Ambulation/Gait assistance: Min guard Ambulation Distance (Feet): 100 Feet Assistive device: Rolling walker (2 wheeled) Gait Pattern/deviations: Decreased step length -  right;Decreased step length - left;Step-through pattern Gait velocity: decreased Gait velocity interpretation: Below normal speed for age/gender General Gait Details: Pt performs gait with increased time. Pt stable with ambulation and no c/o chest pain or other symptoms. Pt's HR at rest was 117 bpm and then when ambulating it went up to 142 bpm but never went higher. It was either in the 120s or 140s and immediately returned to below 120bpm 30 seconds after sitting. Pt pleasant throughout.  Stairs            Wheelchair Mobility    Modified Rankin (Stroke Patients Only)       Balance Overall balance assessment: No apparent balance deficits (not formally assessed)                                           Pertinent Vitals/Pain Pain Assessment: No/denies pain    Home Living Family/patient expects to be discharged to:: Private residence Living Arrangements: Children Available Help at Discharge: Family;Available PRN/intermittently Type of Home: House Home Access: Stairs to enter Entrance Stairs-Rails: None Entrance Stairs-Number of Steps: 2 Home Layout: One level Home Equipment: Walker - 2 wheels      Prior Function Level of Independence: Independent         Comments: Pt was indep with community ambulation, indep with ADLs. Pt no longer drives     Hand Dominance        Extremity/Trunk Assessment   Upper Extremity Assessment:  (Difficult to assess due to pacemaker precautions)  Lower Extremity Assessment: Overall WFL for tasks assessed         Communication   Communication: No difficulties  Cognition Arousal/Alertness: Awake/alert Behavior During Therapy: WFL for tasks assessed/performed Overall Cognitive Status: Within Functional Limits for tasks assessed                      General Comments      Exercises        Assessment/Plan    PT Assessment Patient needs continued PT services  PT Diagnosis  Difficulty walking   PT Problem List Decreased activity tolerance;Cardiopulmonary status limiting activity  PT Treatment Interventions DME instruction;Gait training;Stair training;Functional mobility training;Therapeutic activities;Therapeutic exercise;Balance training;Neuromuscular re-education   PT Goals (Current goals can be found in the Care Plan section) Acute Rehab PT Goals Patient Stated Goal: to return home PT Goal Formulation: With patient Time For Goal Achievement: 04/12/15 Potential to Achieve Goals: Good    Frequency Min 2X/week   Barriers to discharge        Co-evaluation               End of Session Equipment Utilized During Treatment: Gait belt Activity Tolerance: Patient tolerated treatment well Patient left: in bed;with call bell/phone within reach;with bed alarm set;with family/visitor present Nurse Communication: Mobility status (HR response)         Time: 1010-1026 PT Time Calculation (min) (ACUTE ONLY): 16 min   Charges:   PT Evaluation $Initial PT Evaluation Tier I: 1 Procedure     PT G CodesBenna Dunks 04/12/2015, 4:41 PM  Benna Dunks, SPT. [phone]

## 2015-03-29 NOTE — Progress Notes (Signed)
Patient condition improving, denies any pain or discomfort, VSS mood calm, assessment completed for this shift.

## 2015-03-29 NOTE — Care Management (Signed)
have paged attending to obtain order for home health nursing.  Patient with recent discharge for cardiac issues.  Had pacemaker inserted this admission. Home health nursing may prevent another readmission for same.  patient has requested a rollator and says she has not had medicare pay for a walker in the last 5 years.  According to the documentation, an order was placed for rooling walker last discharge and it was provided by [facility].  Will have agency check.

## 2015-03-29 NOTE — Evaluation (Signed)
Physical Therapy Evaluation Patient Details Name: Wendy Key MRN: [id number] DOB: [birth year] Today's Date: 03/29/2015   History of Present Illness  Pt is a 79 yo female who was admitted to the hospital after experiencing dizziness, weakness, and presyncopal symptoms. Pt found to be bradychardic   Clinical Impression  Pt presents with hx of HTN, DM, CAD, PVD, gout, and hx of kidney transplant. Examination reveals that pt performs bed mobility at supervision, transfers at [facility], and ambulation of 100 ft at [facility]. Pt's primary physical deficits appear to be a decrease in activity tolerance, although her current functional strength is fair. During ambulation pt HR goes into 140s (reference gait section for details) but she is non-symptomatic and reports "feeling good" during mobility. Due to her activity tolerance deficits she will continue to benefit from skilled PT in order to return to optimal PLOF.     Follow Up Recommendations Home health PT;Supervision - Intermittent    Equipment Recommendations   (Rollator )    Recommendations for Other Services       Precautions / Restrictions Precautions Precautions: Fall;ICD/Pacemaker Restrictions Weight Bearing Restrictions: No      Mobility  Bed Mobility Overal bed mobility: Needs Assistance Bed Mobility: Supine to Sit     Supine to sit: Supervision     General bed mobility comments: Pt able to perform bed mobility with only cues for use of hands.   Transfers Overall transfer level: Needs assistance Equipment used: Rolling walker (2 wheeled) Transfers: Sit to/from Stand Sit to Stand: Min guard         General transfer comment: Pt transfers with cues for hand placement. Pt strong and stable with transfer  Ambulation/Gait Ambulation/Gait assistance: Min guard Ambulation Distance (Feet): 100 Feet Assistive device: Rolling walker (2 wheeled) Gait Pattern/deviations: Decreased step length - right;Decreased step length -  left;Step-through pattern Gait velocity: decreased Gait velocity interpretation: Below normal speed for age/gender General Gait Details: Pt performs gait with increased time. Pt stable with ambulation and no c/o chest pain or other symptoms. Pt's HR at rest was 117 bpm and then when ambulating it went up to 142 bpm but never went higher. It was either in the 120s or 140s and immediately returned to below 120bpm 30 seconds after sitting. Pt pleasant throughout.  Stairs            Wheelchair Mobility    Modified Rankin (Stroke Patients Only)       Balance Overall balance assessment: No apparent balance deficits (not formally assessed)                                           Pertinent Vitals/Pain Pain Assessment: No/denies pain    Home Living Family/patient expects to be discharged to:: Private residence Living Arrangements: Children Available Help at Discharge: Family;Available PRN/intermittently Type of Home: House Home Access: Stairs to enter Entrance Stairs-Rails: None Entrance Stairs-Number of Steps: 2 Home Layout: One level Home Equipment: Walker - 2 wheels      Prior Function Level of Independence: Independent         Comments: Pt was indep with community ambulation, indep with ADLs. Pt no longer drives     Hand Dominance        Extremity/Trunk Assessment   Upper Extremity Assessment:  (Difficult to assess due to pacemaker precautions)  Lower Extremity Assessment: Overall WFL for tasks assessed         Communication   Communication: No difficulties  Cognition Arousal/Alertness: Awake/alert Behavior During Therapy: WFL for tasks assessed/performed Overall Cognitive Status: Within Functional Limits for tasks assessed                      General Comments      Exercises        Assessment/Plan    PT Assessment Patient needs continued PT services  PT Diagnosis Difficulty walking   PT Problem List  Decreased activity tolerance;Cardiopulmonary status limiting activity  PT Treatment Interventions DME instruction;Gait training;Stair training;Functional mobility training;Therapeutic activities;Therapeutic exercise;Balance training;Neuromuscular re-education   PT Goals (Current goals can be found in the Care Plan section) Acute Rehab PT Goals Patient Stated Goal: to return home PT Goal Formulation: With patient Time For Goal Achievement: 04/12/15 Potential to Achieve Goals: Good    Frequency Min 2X/week   Barriers to discharge        Co-evaluation               End of Session Equipment Utilized During Treatment: Gait belt Activity Tolerance: Patient tolerated treatment well Patient left: in bed;with call bell/phone within reach;with bed alarm set;with family/visitor present Nurse Communication: Mobility status (HR response)         Time: 1010-1026 PT Time Calculation (min) (ACUTE ONLY): 16 min   Charges:         PT G CodesBenna Dunks:        Kailly Richoux 03/29/2015, 2:10 PM  Benna Dunksasey Viridiana Spaid, SPT. [phone]

## 2015-03-29 NOTE — Discharge Summary (Signed)
[facility]   PATIENT NAME: Wendy Key    MR#:  [id number]  DATE OF BIRTH:  09/12/1935  DATE OF ADMISSION:  03/23/2015 ADMITTING PHYSICIAN: Houston Siren, MD  DATE OF DISCHARGE: 03/29/2015  3:27 PM  PRIMARY CARE PHYSICIAN: Pcp Not In System    ADMISSION DIAGNOSIS:  Symptomatic bradycardia [R00.1] Near syncope [R55] Acute renal failure, unspecified acute renal failure type (HCC) [N17.9] Atrial flutter, unspecified type (HCC) [I48.92]   DISCHARGE DIAGNOSIS:  symptomatic bradycardia with A. fib with RVR intermittently-secondary to sick sinus syndrome  acute on chronic renal failure-this is likely ATN secondary to underlying hypotension and bradycardia. hyperkalemia SECONDARY DIAGNOSIS:   Past Medical History  Diagnosis Date  . S/P kidney transplant 11/13/2011    cadevaric transplant  2005 at [facility] Followed by Dr. Cecil Cranker  . Hypertension 11/13/2011  . Hyperlipidemia 11/13/2011  . Diabetes mellitus type II 11/13/2011  . CAD (coronary artery disease) 11/13/2011    50% LAD lesion diagnosed at [facility]  . Asthma 11/13/2011  . Peripheral vascular disease (HCC) 11/13/2011  . Gout 11/13/2011    HOSPITAL COURSE:   #1 symptomatic bradycardia with A. fib with RVR intermittently-secondary to sick sinus syndrome  She was symptomatic at the time of admission with dizziness and weakness -Appreciate cardiology consult.-Dr. paraschos  - Status post pacemaker placement 03/27/15 - Dr Darrold Junker suggested to continue keflex for 7 days after PPM placement.  #2 atrial fibrillation with RVR with history of atrial flutter-with prolonged pauses on metoprolol oral and Toprol 2.5 mg IV as needed basis for RVR Cardizem drip is discontinued, HR under control for now. -Discontinued Xarelto and Plavix but will resume since renal function further improved to baseline. CHADS 2 Vasc is 4 for age, female gender and hypertension Dr. Darrold Junker-  increased dose of metoprolol 50 mg 4 times a day and Cardizem 60 mg 4 times a day .  #3 acute on chronic renal failure-this is likely ATN secondary to underlying hypotension and bradycardia. Renal function is improving to baseline with IV fluids, hold chlorthalidone,  Vasotec and spironolactone,   follow up BMP as outpatient.   #4 hyperkalemia-this is likely secondary to the acute on chronic renal failure. Resolved with Kayexalate.  #5 history of renal transplant-continue mycophenolate, prednisone, Prograf  #6 history of COPD-no acute exacerbation. Continue Spiriva, Dulera.  #7 hypertension-continue metoprolol, hydralazine and clonidine titrate as needed  #DVT prophylaxis with heparin subcutaneous  #8 generalized weakness, need home PT.  DISCHARGE CONDITIONS:   Stable, discharged to home with home health and PT today.  CONSULTS OBTAINED:  Treatment Team:  Ramonita Lab, MD Munsoor Cherylann Ratel, MD  DRUG ALLERGIES:  No Known Allergies  DISCHARGE MEDICATIONS:   Discharge Medication List as of 03/29/2015  1:28 PM    START taking these medications   Details  cephALEXin (KEFLEX) 500 MG capsule Take 1 capsule (500 mg total) by mouth 4 (four) times daily., Starting 03/29/2015, Until Discontinued, Print      CONTINUE these medications which have NOT CHANGED   Details  Calcium Carbonate-Vitamin D (CALCIUM PLUS VITAMIN D PO) Take 1 tablet by mouth daily., Until Discontinued, Historical Med    cloNIDine (CATAPRES) 0.1 MG tablet Take 0.1 mg by mouth 3 (three) times daily., Until Discontinued, Historical Med    clopidogrel (PLAVIX) 75 MG tablet Take 75 mg by mouth daily., Until Discontinued, Historical Med    diltiazem (CARDIZEM LA) 240 MG 24 hr tablet Take 1 tablet (240  mg total) by mouth daily., Starting 03/21/2015, Until Discontinued, Normal    enalapril (VASOTEC) 20 MG tablet Take 20 mg by mouth 2 (two) times daily., Until Discontinued, Historical Med    Fluticasone-Salmeterol  (ADVAIR) 250-50 MCG/DOSE AEPB Inhale 1 puff into the lungs every 12 (twelve) hours., Until Discontinued, Historical Med    metoprolol (LOPRESSOR) 100 MG tablet Take 100 mg by mouth 2 (two) times daily., Until Discontinued, Historical Med    mycophenolate (MYFORTIC) 360 MG TBEC Take 360 mg by mouth 2 (two) times daily., Until Discontinued, Historical Med    pravastatin (PRAVACHOL) 40 MG tablet Take 40 mg by mouth daily., Until Discontinued, Historical Med    predniSONE (DELTASONE) 5 MG tablet Take 5 mg by mouth daily., Until Discontinued, Historical Med    Rivaroxaban (XARELTO) 15 MG TABS tablet Take 1 tablet (15 mg total) by mouth daily with supper., Starting 03/21/2015, Until Discontinued, Normal    spironolactone (ALDACTONE) 25 MG tablet Take 25 mg by mouth daily., Until Discontinued, Historical Med    tacrolimus (PROGRAF) 1 MG capsule Take 2 mg by mouth 2 (two) times daily., Until Discontinued, Historical Med    tiotropium (SPIRIVA) 18 MCG inhalation capsule Place 18 mcg into inhaler and inhale daily., Until Discontinued, Historical Med      STOP taking these medications     chlorthalidone (HYGROTON) 25 MG tablet      felodipine (PLENDIL) 10 MG 24 hr tablet        Vasotec and spironolactone are also discontinued. I tried to refresh medication reconciliation list but failed several times. I discussed with the RN to inform patient to discontinue Vasotec and spironolactone.  DISCHARGE INSTRUCTIONS:    If you experience worsening of your admission symptoms, develop shortness of breath, life threatening emergency, suicidal or homicidal thoughts you must seek medical attention immediately by calling 911 or calling your MD immediately  if symptoms less severe.  You Must read complete instructions/literature along with all the possible adverse reactions/side effects for all the Medicines you take and that have been prescribed to you. Take any new Medicines after you have completely  understood and accept all the possible adverse reactions/side effects.   Please note  You were cared for by a hospitalist during your hospital stay. If you have any questions about your discharge medications or the care you received while you were in the hospital after you are discharged, you can call the unit and asked to speak with the hospitalist on call if the hospitalist that took care of you is not available. Once you are discharged, your primary care physician will handle any further medical issues. Please note that NO REFILLS for any discharge medications will be authorized once you are discharged, as it is imperative that you return to your primary care physician (or establish a relationship with a primary care physician if you do not have one) for your aftercare needs so that they can reassess your need for medications and monitor your lab values.    Today   SUBJECTIVE    no complaint.    VITAL SIGNS:  Blood pressure 113/76, pulse 109, temperature 97.4 F (36.3 C), temperature source Oral, resp. rate 21, height 5\' 6"  (1.676 m), weight 71.8 kg (158 lb 4.6 oz), SpO2 99 %.  I/O:   Intake/Output Summary (Last 24 hours) at 03/29/15 1833 Last data filed at 03/29/15 1300  Gross per 24 hour  Intake      0 ml  Output    300  ml  Net   -300 ml    PHYSICAL EXAMINATION:  GENERAL:  79 y.o.-year-old patient lying in the bed with no acute distress.  EYES: Pupils equal, round, reactive to light and accommodation. No scleral icterus. Extraocular muscles intact.  HEENT: Head atraumatic, normocephalic. Oropharynx and nasopharynx clear.  NECK:  Supple, no jugular venous distention. No thyroid enlargement, no tenderness.  LUNGS: Normal breath sounds bilaterally, no wheezing, rales,rhonchi or crepitation. No use of accessory muscles of respiration.  CARDIOVASCULAR: S1, S2 normal. No murmurs, rubs, or gallops.  ABDOMEN: Soft, non-tender, non-distended. Bowel sounds present. No organomegaly or  mass.  EXTREMITIES: No pedal edema, cyanosis, or clubbing.  NEUROLOGIC: Cranial nerves II through XII are intact. Muscle strength 5/5 in all extremities. Sensation intact. Gait not checked.  PSYCHIATRIC: The patient is alert and oriented x 3.  SKIN: No obvious rash, lesion, or ulcer.   DATA REVIEW:   CBC  Recent Labs Lab 03/27/15 0440  WBC 7.2  HGB 12.2  HCT 37.2  PLT 251    Chemistries   Recent Labs Lab 03/29/15 0455  NA 136  K 4.3  CL 108  CO2 22  GLUCOSE 121*  BUN 37*  CREATININE 1.66*  CALCIUM 9.0    Cardiac Enzymes  Recent Labs Lab 03/23/15 1833  TROPONINI 0.04*    Microbiology Results  Results for orders placed or performed during the hospital encounter of 03/23/15  MRSA PCR Screening     Status: None   Collection Time: 03/23/15  9:30 PM  Result Value Ref Range Status   MRSA by PCR NEGATIVE NEGATIVE Final    Comment:        The GeneXpert MRSA Assay (FDA approved for NASAL specimens only), is one component of a comprehensive MRSA colonization surveillance program. It is not intended to diagnose MRSA infection nor to guide or monitor treatment for MRSA infections.     RADIOLOGY:  No results found.      Management plans discussed with the patient, family and they are in agreement.  CODE STATUS:   TOTAL TIME TAKING CARE OF THIS PATIENT: 45 minutes.    Shaune Pollack M.D on 03/29/2015 at 6:33 PM  Between 7am to 6pm - Pager - [phone]  After 6pm go to [email or web address] - password [email or web address]  [facility]  Office  [phone]  CC: Primary care physician; Pcp Not In System

## 2015-03-29 NOTE — Progress Notes (Signed)
Called patient Ms [place] Diana this evening to update her and her family and was told to stop taking  vasoted and spiralacton per Dr Imogene Burnchen recommendations. Patient s daughter Seymour BarsBrafsher ,Gloria verbalized understanding of Md recomemdation.

## 2015-03-29 NOTE — Progress Notes (Signed)
Patient to receive midnight dose of Cardizem PO 60mg . However current pressure 105/53 HR 66. MD notified of current VS and questioned about holding dose of medication. MD order to hold this dose of medication

## 2015-03-29 NOTE — Care Management (Signed)
Patient is agreeable to receive home health nurse and physical therapy.  Chose [facility] from [facility].  Agency can accept the referral and initial visit will be 11/17.  Patient had requested a Rolator walker.  Stated that her medicare has not paid for one within the last 5 years.  Found that on [facility] discharge last week a referral had been sent to [facility] for walker.  This was confirmed.  Informed patient she could pay privately and she declined.  She did acknowledge she had just received a new walker.

## 2015-03-29 NOTE — Care Management (Signed)
PT evaluation is pending.  At present time, previous CM has not identified any discharge needs

## 2015-03-29 NOTE — Progress Notes (Signed)
Patient was dicharged home as per order, discharge instruction provided, IV removed, tele removed. Patient discharged home as order.

## 2015-03-29 NOTE — Care Management (Signed)
Evaluated by physical therapy. Patient's heart rate up to 140's with exertion.  Updated primary nurse.

## 2015-03-29 NOTE — Discharge Instructions (Signed)
Heart healthy diet Activity as tolerated.  HOME HEALTH NURSE AND PHYSICAL THERAPY   [facility].  [phone]  OR [phone]   NURSE AND PHYSICAL THERAPY.  FIRST VISIT WILL BE 03/30/2015

## 2015-04-24 ENCOUNTER — Ambulatory Visit: Payer: Medicare Other

## 2015-04-24 ENCOUNTER — Ambulatory Visit
Admission: EM | Admit: 2015-04-24 | Discharge: 2015-04-24 | Disposition: A | Discharge status: Home / Self Care | Payer: Medicare Other | Attending: Family Medicine | Admitting: Family Medicine

## 2015-04-24 ENCOUNTER — Encounter: Payer: Self-pay | Admitting: *Deleted

## 2015-04-24 DIAGNOSIS — Z79899 Other long term (current) drug therapy: Secondary | ICD-10-CM | POA: Insufficient documentation

## 2015-04-24 DIAGNOSIS — I2510 Atherosclerotic heart disease of native coronary artery without angina pectoris: Secondary | ICD-10-CM | POA: Insufficient documentation

## 2015-04-24 DIAGNOSIS — J90 Pleural effusion, not elsewhere classified: Secondary | ICD-10-CM | POA: Diagnosis not present

## 2015-04-24 DIAGNOSIS — J948 Other specified pleural conditions: Secondary | ICD-10-CM

## 2015-04-24 DIAGNOSIS — E119 Type 2 diabetes mellitus without complications: Secondary | ICD-10-CM | POA: Diagnosis not present

## 2015-04-24 DIAGNOSIS — I10 Essential (primary) hypertension: Secondary | ICD-10-CM | POA: Diagnosis not present

## 2015-04-24 DIAGNOSIS — Z7901 Long term (current) use of anticoagulants: Secondary | ICD-10-CM | POA: Insufficient documentation

## 2015-04-24 DIAGNOSIS — Z940 Kidney transplant status: Secondary | ICD-10-CM | POA: Insufficient documentation

## 2015-04-24 DIAGNOSIS — M109 Gout, unspecified: Secondary | ICD-10-CM | POA: Diagnosis not present

## 2015-04-24 DIAGNOSIS — J45909 Unspecified asthma, uncomplicated: Secondary | ICD-10-CM | POA: Insufficient documentation

## 2015-04-24 DIAGNOSIS — I739 Peripheral vascular disease, unspecified: Secondary | ICD-10-CM | POA: Diagnosis not present

## 2015-04-24 DIAGNOSIS — E785 Hyperlipidemia, unspecified: Secondary | ICD-10-CM | POA: Diagnosis not present

## 2015-04-24 DIAGNOSIS — I509 Heart failure, unspecified: Secondary | ICD-10-CM | POA: Insufficient documentation

## 2015-04-24 DIAGNOSIS — R0981 Nasal congestion: Secondary | ICD-10-CM | POA: Diagnosis present

## 2015-04-24 DIAGNOSIS — R05 Cough: Secondary | ICD-10-CM | POA: Diagnosis present

## 2015-04-24 MED ORDER — LEVALBUTEROL HCL 1.25 MG/0.5ML IN NEBU
1.2500 mg | INHALATION_SOLUTION | Freq: Once | RESPIRATORY_TRACT | Status: AC
  Administered 2015-04-24: 1.25 mg via RESPIRATORY_TRACT

## 2015-04-24 NOTE — ED Notes (Signed)
Cough and stuffy nose started this past Friday and wheezing started this AM. Patient reports clear mucus.

## 2015-04-24 NOTE — ED Provider Notes (Signed)
CSN: [id number]     Arrival date & time 04/24/15  1137 History   First MD Initiated Contact with Patient 04/24/15 1216     Chief Complaint  Patient presents with  . Cough  . Wheezing  . Nasal Congestion   (Consider location/radiation/quality/duration/timing/severity/associated sxs/prior Treatment) HPI Comments: 79 yo female with a h/o DM, CAD, kidney transplant (2005?), s/p pacemaker placement on 03/27/15, presents with a 3 days h/o cough, shortness of breath, wheezing and leg swelling which has is slowly progressively worsening. States has also had a mild stuffy nose. Denies sore throat, fevers, chills, chest pains, vomiting, diarrhea.   Patient is a 79 y.o. female presenting with cough and wheezing. The history is provided by the patient.  Cough Associated symptoms: wheezing   Wheezing Associated symptoms: cough     Past Medical History  Diagnosis Date  . S/P kidney transplant 11/13/2011    [facility]  [facility] Followed by Dr. Cecil CrankerEmily Arnold  . Hypertension 11/13/2011  . Hyperlipidemia 11/13/2011  . Diabetes mellitus type II 11/13/2011  . CAD (coronary artery disease) 11/13/2011    [facility] L[facility]  . Asthma 11/13/2011  . Peripheral vascular disease (HCC) 11/13/2011  . Gout 11/13/2011   Past Surgical History  Procedure Laterality Date  . Kid[facility]  2005    [facility]  . Av fistula placement      x2 with removal of left arm fistula  . Abdominal hysterectomy  1977    for fibroids  . Pacemaker insertion N/A 03/27/2015    Procedure: INSERTION PACEMAKER;  Surgeon: Marcina MillardAlexander Paraschos, MD;  Location: [facility] ORS;  Service: Cardiovascular;  Laterality: N/A;   Family History  Problem Relation Age of Onset  . Diabetes Father   . Diabetes type II Son    Social History  Substance Use Topics  . Smoking status: Never Smoker   . Smokeless tobacco: Never Used  . Alcohol Use: No   OB History    No data available     Review of Systems   Respiratory: Positive for cough and wheezing.     Allergies  Review of patient's allergies indicates no known allergies.  Home Medications   Prior to Admission medications   Medication Sig Start Date End Date Taking? Authorizing Provider  Calcium Carbonate-Vitamin D (CALCIUM PLUS VITAMIN D PO) Take 1 tablet by mouth daily.   Yes Historical Provider, MD  chlorthalidone (HYGROTON) 25 MG tablet Take 25 mg by mouth daily.   Yes Historical Provider, MD  cloNIDine (CATAPRES) 0.1 MG tablet Take 0.1 mg by mouth 3 (three) times daily.   Yes Historical Provider, MD  clopidogrel (PLAVIX) 75 MG tablet Take 75 mg by mouth daily.   Yes Historical Provider, MD  felodipine (PLENDIL) 10 MG 24 hr tablet Take 10 mg by mouth 2 (two) times daily.   Yes Historical Provider, MD  Fluticasone-Salmeterol (ADVAIR) 250-50 MCG/DOSE AEPB Inhale 1 puff into the lungs every 12 (twelve) hours.   Yes Historical Provider, MD  metoprolol (LOPRESSOR) 100 MG tablet Take 100 mg by mouth 2 (two) times daily.   Yes Historical Provider, MD  mycophenolate (MYFORTIC) 360 MG TBEC Take 360 mg by mouth 2 (two) times daily.   Yes Historical Provider, MD  omeprazole (PRILOSEC OTC) 20 MG tablet Take 20 mg by mouth daily.   Yes Historical Provider, MD  pravastatin (PRAVACHOL) 40 MG tablet Take 40 mg by mouth daily.   Yes Historical Provider, MD  predniSONE (DELTASONE) 5 MG tablet Take 5 mg by mouth daily.   Yes Historical Provider, MD  tacrolimus (PROGRAF) 1 MG capsule Take 2 mg by mouth 2 (two) times daily.   Yes Historical Provider, MD  tiotropium (SPIRIVA) 18 MCG inhalation capsule Place 18 mcg into inhaler and inhale daily.   Yes Historical Provider, MD  cephALEXin (KEFLEX) 500 MG capsule Take 1 capsule (500 mg total) by mouth 4 (four) times daily. 03/29/15   Shaune Pollack, MD  diltiazem (CARDIZEM LA) 240 MG 24 hr tablet Take 1 tablet (240 mg total) by mouth daily. 03/21/15   Auburn Bilberry, MD  Rivaroxaban (XARELTO) 15 MG TABS tablet Take  1 tablet (15 mg total) by mouth daily with supper. 03/21/15   Auburn Bilberry, MD   Meds Ordered and Administered this Visit   Medications  levalbuterol (XOPENEX) nebulizer solution 1.25 mg (1.25 mg Nebulization Given 04/24/15 1323)    BP 116/61 mmHg  Pulse 61  Temp(Src) 98.3 F (36.8 C) (Oral)  Resp 20  Ht  (1.651 m)  Wt 150 lb (68.04 kg)  BMI 24.96 kg/m2  SpO2 95% No data found.   Physical Exam  Constitutional: She appears well-developed and well-nourished. No distress.  HENT:  Head: Normocephalic.  Mouth/Throat: Oropharynx is clear and moist and mucous membranes are normal.  Neck: Normal range of motion. Neck supple. No JVD present. No tracheal deviation present. No thyromegaly present.  Cardiovascular: Normal rate, regular rhythm, normal heart sounds and intact distal pulses.   No murmur heard. Pulmonary/Chest: Effort normal. No stridor. No respiratory distress. She has no wheezes. She has rales (bases bilaterally). She exhibits no tenderness.  Abdominal: Bowel sounds are normal.  Musculoskeletal: She exhibits edema.  Lymphadenopathy:    She has no cervical adenopathy.  Neurological: She is alert.  Skin: No rash noted. She is not diaphoretic.  Vitals reviewed.   ED Course  Procedures (including critical care time)  Labs Review Labs Reviewed - No data to display  Imaging Review Dg Chest 2 View  04/24/2015  CLINICAL DATA:  Shortness of breath today. Pacemaker placed November 14. EXAM: CHEST  2 VIEW COMPARISON:  Chest radiograph 03/27/2015. Also chest radiograph 03/17/2015. FINDINGS: Marked cardiac enlargement, increased from priors. Single lead pacer from LEFT subclavian approach lies with its tip in the RIGHT ventricle. Moderate-sized RIGHT pleural effusion with compressive atelectasis. Mild vascular congestion. Significant calcification of the transverse arch. Osteopenia. No pneumothorax. IMPRESSION: Significant worsening aeration. Moderate-sized RIGHT pleural  effusion with cardiomegaly and vascular congestion. No pneumothorax. Single lead pacer remains unchanged in position. Electronically Signed   By: Elsie Stain M.D.   On: 04/24/2015 13:56     Visual Acuity Review  Right Eye Distance:   Left Eye Distance:   Bilateral Distance:    Right Eye Near:   Left Eye Near:    Bilateral Near:         MDM   1. Acute congestive heart failure, unspecified congestive heart failure type (HCC)   2. Pleural effusion, right    1. x-ray results and diagnosis reviewed with patient and son; due to patient's chronic medical problems/history, recent procedure and new findings recommend patient go to [facility] ED for further evaluation/management and possible admission. Patient and son verbalize understanding and son states he will drive his mother to the ED.   Payton Mccallum, MD 04/24/15 [phone]

## 2015-11-12 ENCOUNTER — Encounter: Payer: Self-pay | Admitting: Emergency Medicine

## 2015-11-12 ENCOUNTER — Ambulatory Visit
Admission: EM | Admit: 2015-11-12 | Discharge: 2015-11-12 | Disposition: A | Discharge status: Home / Self Care | Payer: Medicare Other | Attending: Family Medicine | Admitting: Family Medicine

## 2015-11-12 DIAGNOSIS — S161XXA Strain of muscle, fascia and tendon at neck level, initial encounter: Secondary | ICD-10-CM | POA: Diagnosis not present

## 2015-11-12 DIAGNOSIS — M542 Cervicalgia: Secondary | ICD-10-CM | POA: Diagnosis not present

## 2015-11-12 DIAGNOSIS — M1000 Idiopathic gout, unspecified site: Secondary | ICD-10-CM

## 2015-11-12 MED ORDER — PREDNISONE 20 MG PO TABS: ORAL_TABLET | ORAL | Status: AC

## 2015-11-12 MED ORDER — HYDROCODONE-ACETAMINOPHEN 5-325 MG PO TABS: ORAL_TABLET | ORAL | Status: AC

## 2015-11-12 NOTE — ED Notes (Signed)
Neck pain for 1 week. Has used Tylenol and heating pad. RIght hand pain. Was seen by her PCP and told she had Gout. Was given prednisone. Hand is swollen and in pain.

## 2015-11-12 NOTE — Discharge Instructions (Signed)
Cervical Sprain °A cervical sprain is an injury in the neck in which the strong, fibrous tissues (ligaments) that connect your neck bones stretch or tear. Cervical sprains can range from mild to severe. Severe cervical sprains can cause the neck vertebrae to be unstable. This can lead to damage of the spinal cord and can result in serious nervous system problems. The amount of time it takes for a cervical sprain to get better depends on the cause and extent of the injury. Most cervical sprains heal in 1 to 3 weeks. °CAUSES  °Severe cervical sprains may be caused by:  °· Contact sport injuries (such as from football, rugby, wrestling, hockey, auto racing, gymnastics, diving, martial arts, or boxing).   °· Motor vehicle collisions.   °· Whiplash injuries. This is an injury from a sudden forward and backward whipping movement of the head and neck.  °· Falls.   °Mild cervical sprains may be caused by:  °· Being in an awkward position, such as while cradling a telephone between your ear and shoulder.   °· Sitting in a chair that does not offer proper support.   °· Working at a poorly designed computer station.   °· Looking up or down for long periods of time.   °SYMPTOMS  °· Pain, soreness, stiffness, or a burning sensation in the front, back, or sides of the neck. This discomfort may develop immediately after the injury or slowly, 24 hours or more after the injury.   °· Pain or tenderness directly in the middle of the back of the neck.   °· Shoulder or upper back pain.   °· Limited ability to move the neck.   °· Headache.   °· Dizziness.   °· Weakness, numbness, or tingling in the hands or arms.   °· Muscle spasms.   °· Difficulty swallowing or chewing.   °· Tenderness and swelling of the neck.   °DIAGNOSIS  °Most of the time your health care provider can diagnose a cervical sprain by taking your history and doing a physical exam. Your health care provider will ask about previous neck injuries and any known neck  problems, such as arthritis in the neck. X-rays may be taken to find out if there are any other problems, such as with the bones of the neck. Other tests, such as a CT scan or MRI, may also be needed.  °TREATMENT  °Treatment depends on the severity of the cervical sprain. Mild sprains can be treated with rest, keeping the neck in place (immobilization), and pain medicines. Severe cervical sprains are immediately immobilized. Further treatment is done to help with pain, muscle spasms, and other symptoms and may include: °· Medicines, such as pain relievers, numbing medicines, or muscle relaxants.   °· Physical therapy. This may involve stretching exercises, strengthening exercises, and posture training. Exercises and improved posture can help stabilize the neck, strengthen muscles, and help stop symptoms from returning.   °HOME CARE INSTRUCTIONS  °· Put ice on the injured area.   °¨ Put ice in a plastic bag.   °¨ Place a towel between your skin and the bag.   °¨ Leave the ice on for 15-20 minutes, 3-4 times a day.   °· If your injury was severe, you may have been given a cervical collar to wear. A cervical collar is a two-piece collar designed to keep your neck from moving while it heals. °¨ Do not remove the collar unless instructed by your health care provider. °¨ If you have long hair, keep it outside of the collar. °¨ Ask your health care provider before making any adjustments to your collar. Minor   adjustments may be required over time to improve comfort and reduce pressure on your chin or on the back of your head.  Ifyou are allowed to remove the collar for cleaning or bathing, follow your health care provider's instructions on how to do so safely.  Keep your collar clean by wiping it with mild soap and water and drying it completely. If the collar you have been given includes removable pads, remove them every 1-2 days and hand wash them with soap and water. Allow them to air dry. They should be completely  dry before you wear them in the collar.  If you are allowed to remove the collar for cleaning and bathing, wash and dry the skin of your neck. Check your skin for irritation or sores. If you see any, tell your health care provider.  Do not drive while wearing the collar.   Only take over-the-counter or prescription medicines for pain, discomfort, or fever as directed by your health care provider.   Keep all follow-up appointments as directed by your health care provider.   Keep all physical therapy appointments as directed by your health care provider.   Make any needed adjustments to your workstation to promote good posture.   Avoid positions and activities that make your symptoms worse.   Warm up and stretch before being active to help prevent problems.  SEEK MEDICAL CARE IF:   Your pain is not controlled with medicine.   You are unable to decrease your pain medicine over time as planned.   Your activity level is not improving as expected.  SEEK IMMEDIATE MEDICAL CARE IF:   You develop any bleeding.  You develop stomach upset.  You have signs of an allergic reaction to your medicine.   Your symptoms get worse.   You develop new, unexplained symptoms.   You have numbness, tingling, weakness, or paralysis in any part of your body.  MAKE SURE YOU:   Understand these instructions.  Will watch your condition.  Will get help right away if you are not doing well or get worse.   This information is not intended to replace advice given to you by your health care provider. Make sure you discuss any questions you have with your health care provider.   Document Released: 02/24/2007 Document Revised: 05/04/2013 Document Reviewed: 11/04/2012 Elsevier Interactive Patient Education 2016 Elsevier Inc[employer] Gout Gout is an inflammatory arthritis caused by a buildup of uric acid crystals in the joints. Uric acid is a chemical that is normally present in the blood. When the  level of uric acid in the blood is too high it can form crystals that deposit in your joints and tissues. This causes joint redness, soreness, and swelling (inflammation). Repeat attacks are common. Over time, uric acid crystals can form into masses (tophi) near a joint, destroying bone and causing disfigurement. Gout is treatable and often preventable. CAUSES  The disease begins with elevated levels of uric acid in the blood. Uric acid is produced by your body when it breaks down a naturally found substance called purines. Certain foods you eat, such as meats and fish, contain high amounts of purines. Causes of an elevated uric acid level include:  Being passed down from parent to child (heredity).  Diseases that cause increased uric acid production (such as obesity, psoriasis, and certain cancers).  Excessive alcohol use.  Diet, especially diets rich in meat and seafood.  Medicines, including certain cancer-fighting medicines (chemotherapy), water pills (diuretics), and aspirin.  Chronic kidney disease.  The kidneys are no longer able to remove uric acid well.  Problems with metabolism. Conditions strongly associated with gout include:  Obesity.  High blood pressure.  High cholesterol.  Diabetes. Not everyone with elevated uric acid levels gets gout. It is not understood why some people get gout and others do not. Surgery, joint injury, and eating too much of certain foods are some of the factors that can lead to gout attacks. SYMPTOMS   An attack of gout comes on quickly. It causes intense pain with redness, swelling, and warmth in a joint.  Fever can occur.  Often, only one joint is involved. Certain joints are more commonly involved:  Base of the big toe.  Knee.  Ankle.  Wrist.  Finger. Without treatment, an attack usually goes away in a few days to weeks. Between attacks, you usually will not have symptoms, which is different from many other forms of  arthritis. DIAGNOSIS  Your caregiver will suspect gout based on your symptoms and exam. In some cases, tests may be recommended. The tests may include:  Blood tests.  Urine tests.  X-rays.  Joint fluid exam. This exam requires a needle to remove fluid from the joint (arthrocentesis). Using a microscope, gout is confirmed when uric acid crystals are seen in the joint fluid. TREATMENT  There are two phases to gout treatment: treating the sudden onset (acute) attack and preventing attacks (prophylaxis).  Treatment of an Acute Attack.  Medicines are used. These include anti-inflammatory medicines or steroid medicines.  An injection of steroid medicine into the affected joint is sometimes necessary.  The painful joint is rested. Movement can worsen the arthritis.  You may use warm or cold treatments on painful joints, depending which works best for you.  Treatment to Prevent Attacks.  If you suffer from frequent gout attacks, your caregiver may advise preventive medicine. These medicines are started after the acute attack subsides. These medicines either help your kidneys eliminate uric acid from your body or decrease your uric acid production. You may need to stay on these medicines for a very long time.  The early phase of treatment with preventive medicine can be associated with an increase in acute gout attacks. For this reason, during the first few months of treatment, your caregiver may also advise you to take medicines usually used for acute gout treatment. Be sure you understand your caregiver's directions. Your caregiver may make several adjustments to your medicine dose before these medicines are effective.  Discuss dietary treatment with your caregiver or dietitian. Alcohol and drinks high in sugar and fructose and foods such as meat, poultry, and seafood can increase uric acid levels. Your caregiver or dietitian can advise you on drinks and foods that should be limited. HOME  CARE INSTRUCTIONS   Do not take aspirin to relieve pain. This raises uric acid levels.  Only take over-the-counter or prescription medicines for pain, discomfort, or fever as directed by your caregiver.  Rest the joint as much as possible. When in bed, keep sheets and blankets off painful areas.  Keep the affected joint raised (elevated).  Apply warm or cold treatments to painful joints. Use of warm or cold treatments depends on which works best for you.  Use crutches if the painful joint is in your leg.  Drink enough fluids to keep your urine clear or pale yellow. This helps your body get rid of uric acid. Limit alcohol, sugary drinks, and fructose drinks.  Follow your dietary instructions. Pay careful attention to  the amount of protein you eat. Your daily diet should emphasize fruits, vegetables, whole grains, and fat-free or low-fat milk products. Discuss the use of coffee, vitamin C, and cherries with your caregiver or dietitian. These may be helpful in lowering uric acid levels.  Maintain a healthy body weight. SEEK MEDICAL CARE IF:   You develop diarrhea, vomiting, or any side effects from medicines.  You do not feel better in 24 hours, or you are getting worse. SEEK IMMEDIATE MEDICAL CARE IF:   Your joint becomes suddenly more tender, and you have chills or a fever. MAKE SURE YOU:   Understand these instructions.  Will watch your condition.  Will get help right away if you are not doing well or get worse.   This information is not intended to replace advice given to you by your health care provider. Make sure you discuss any questions you have with your health care provider.   Document Released: 04/26/2000 Document Revised: 05/20/2014 Document Reviewed: 12/11/2011 Elsevier Interactive Patient Education [employer]2016 Elsevier Inc.

## 2015-11-30 ENCOUNTER — Ambulatory Visit
Admission: EM | Admit: 2015-11-30 | Discharge: 2015-11-30 | Disposition: A | Discharge status: Home / Self Care | Payer: Medicare Other | Attending: Family Medicine | Admitting: Family Medicine

## 2015-11-30 ENCOUNTER — Ambulatory Visit: Payer: Medicare Other

## 2015-11-30 ENCOUNTER — Encounter: Payer: Self-pay | Admitting: Emergency Medicine

## 2015-11-30 DIAGNOSIS — I10 Essential (primary) hypertension: Secondary | ICD-10-CM | POA: Diagnosis not present

## 2015-11-30 DIAGNOSIS — Z7982 Long term (current) use of aspirin: Secondary | ICD-10-CM | POA: Diagnosis not present

## 2015-11-30 DIAGNOSIS — M25531 Pain in right wrist: Secondary | ICD-10-CM | POA: Diagnosis present

## 2015-11-30 DIAGNOSIS — I739 Peripheral vascular disease, unspecified: Secondary | ICD-10-CM | POA: Insufficient documentation

## 2015-11-30 DIAGNOSIS — M11831 Other specified crystal arthropathies, right wrist: Secondary | ICD-10-CM

## 2015-11-30 DIAGNOSIS — Z940 Kidney transplant status: Secondary | ICD-10-CM | POA: Insufficient documentation

## 2015-11-30 DIAGNOSIS — Z950 Presence of cardiac pacemaker: Secondary | ICD-10-CM | POA: Diagnosis not present

## 2015-11-30 DIAGNOSIS — J45909 Unspecified asthma, uncomplicated: Secondary | ICD-10-CM | POA: Insufficient documentation

## 2015-11-30 DIAGNOSIS — M11231 Other chondrocalcinosis, right wrist: Secondary | ICD-10-CM | POA: Diagnosis not present

## 2015-11-30 DIAGNOSIS — E785 Hyperlipidemia, unspecified: Secondary | ICD-10-CM | POA: Diagnosis not present

## 2015-11-30 DIAGNOSIS — E119 Type 2 diabetes mellitus without complications: Secondary | ICD-10-CM | POA: Diagnosis not present

## 2015-11-30 DIAGNOSIS — Z79899 Other long term (current) drug therapy: Secondary | ICD-10-CM | POA: Insufficient documentation

## 2015-11-30 DIAGNOSIS — Z7901 Long term (current) use of anticoagulants: Secondary | ICD-10-CM | POA: Diagnosis not present

## 2015-11-30 DIAGNOSIS — I2510 Atherosclerotic heart disease of native coronary artery without angina pectoris: Secondary | ICD-10-CM | POA: Insufficient documentation

## 2015-11-30 MED ORDER — METHYLPREDNISOLONE 4 MG PO TBPK: ORAL_TABLET | ORAL | Status: AC

## 2015-11-30 NOTE — Discharge Instructions (Signed)
Calcium Pyrophosphate Deposition   Calcium pyrophosphate deposition (CPPD), which is also called pseudogout, is a type of arthritis that causes pain, swelling, and inflammation in a joint. The joint pain can be severe and may last for days. If it is not treated, the pain may last much longer. Attacks of CPPD may come and go. This condition usually affects one joint at a time. The joints that are affected most commonly are the knees, but this condition can also affect the wrists, elbows, shoulders, or ankles.  CPPD is similar to gout. Both conditions result from the buildup of crystals in the joint. However, CPPD is caused by a type of crystal that is different than the crystals that cause gout.  CAUSES  This condition is caused by the buildup of calcium pyrophosphate dihydrate crystals in the joint. The reason why this buildup occurs is not known. The condition may be passed down from parent to child (hereditary).  RISK FACTORS  This condition is more likely to develop in people who:  · Are over 60 years old.  · Have a family history of the condition.  · Have had joint replacement surgery.  · Have had a recent injury.  · Have certain medical conditions, such as hemophilia, ochronosis, amyloidosis, or hormonal disorders.  · Have low blood magnesium levels.  SYMPTOMS  Symptoms of this condition include:  · Pain in a joint. The pain may:    Be intense and constant.    Come on quickly.    Get worse with movement.    Last from several days to a few weeks.  · Redness, swelling, and warmth at the joint.  · Stiffness of the joint.  DIAGNOSIS  To diagnose this condition, your health care provider will use a needle to remove fluid from the joint. The fluid will be examined under a microscope to check for the crystals that cause CPPD. You may also have imaging tests, such as:  · X-rays.  · Ultrasound.  TREATMENT  There is no way to remove the crystals from the joint and no way to cure this condition. However, treatment can  relieve symptoms and improve joint function. Treatment may include:  · Nonsteroidal anti-inflammatory drugs (NSAIDs) to reduce inflammation and pain.  · Medicines to help prevent attacks.  · Injections of medicine (cortisone) into the joint to reduce pain and swelling.  · Physical therapy to improve joint function.  HOME CARE INSTRUCTIONS  · Take medicines only as directed by your health care provider.  · Rest the affected joints until your symptoms start to go away.  · Keep your affected joints raised (elevated) when possible. This will help to reduce swelling.  · If directed, apply ice to the affected area:    Put ice in a plastic bag.    Place a towel between your skin and the bag.    Leave the ice on for 20 minutes, 2-3 times per day.  · If the painful joint is in your leg, use crutches as directed by your health care provider.  · When your symptoms start to go away, begin to exercise regularly or do physical therapy. Talk with your health care provider or physical therapist about what types of exercise are safe for you. Low-impact exercise may be best. This includes walking, swimming, bicycling, and water aerobics.  · Maintain a healthy weight so your joints do not need to bear more weight than necessary.  SEEK MEDICAL CARE IF:  · You have an increase   in joint pain that is not relieved with medicine.  · Your joint becomes more red, swollen, or stiff.  · You have a fever.  · You have a skin rash.     This information is not intended to replace advice given to you by your health care provider. Make sure you discuss any questions you have with your health care provider.     Document Released: 01/20/2004 Document Revised: 09/13/2014 Document Reviewed: 04/06/2014  Elsevier Interactive Patient Education ©2016 Elsevier Inc.

## 2015-11-30 NOTE — ED Provider Notes (Signed)
CSN: [id number]     Arrival date & time 11/30/15  1334 History   First MD Initiated Contact with Patient 11/30/15 1452     Chief Complaint  Patient presents with  . Hand Pain   (Consider location/radiation/quality/duration/timing/severity/associated sxs/prior Treatment) HPI   This a 80 year old female who was seen here on July 2 with a gout attack of her wrist. Given prednisone at that time and actually improved but after she stopped it and began eating tomatoes last night she experienced a swelling pain and warmth. She has a history of kidney disease having undergone a kidney transplant with a cadaver kidney and is on antirejection medications. He has a nephrologist but has never seen a rheumatologist.  Past Medical History  Diagnosis Date  . S/P kidney transplant 11/13/2011    cadevaric transplant  2005 at [facility] Followed by Dr. Cecil Cranker  . Hypertension 11/13/2011  . Hyperlipidemia 11/13/2011  . Diabetes mellitus type II 11/13/2011  . CAD (coronary artery disease) 11/13/2011    50% LAD lesion diagnosed at [facility]  . Asthma 11/13/2011  . Peripheral vascular disease (HCC) 11/13/2011  . Gout 11/13/2011   Past Surgical History  Procedure Laterality Date  . Kidney transplant  2005    At [facility]  . Av fistula placement      x2 with removal of left arm fistula  . Abdominal hysterectomy  1977    for fibroids  . Pacemaker insertion N/A 03/27/2015    Procedure: INSERTION PACEMAKER;  Surgeon: Marcina Millard, MD;  Location: [facility] ORS;  Service: Cardiovascular;  Laterality: N/A;   Family History  Problem Relation Age of Onset  . Diabetes Father   . Diabetes type II Son    Social History  Substance Use Topics  . Smoking status: Never Smoker   . Smokeless tobacco: Never Used  . Alcohol Use: No   OB History    No data available     Review of Systems  Constitutional: Positive for activity change. Negative for fever, chills and fatigue.  Musculoskeletal: Positive for  arthralgias.  All other systems reviewed and are negative.   Allergies  Review of patient's allergies indicates no known allergies.  Home Medications   Prior to Admission medications   Medication Sig Start Date End Date Taking? Authorizing Provider  apixaban (ELIQUIS) 2.5 MG TABS tablet Take 2.5 mg by mouth 2 (two) times daily.    Historical Provider, MD  aspirin 81 MG tablet Take 81 mg by mouth daily.    Historical Provider, MD  Calcium Carbonate-Vitamin D (CALCIUM PLUS VITAMIN D PO) Take 1 tablet by mouth daily.    Historical Provider, MD  cephALEXin (KEFLEX) 500 MG capsule Take 1 capsule (500 mg total) by mouth 4 (four) times daily. 03/29/15   Shaune Pollack, MD  chlorthalidone (HYGROTON) 25 MG tablet Take 25 mg by mouth daily.    Historical Provider, MD  cloNIDine (CATAPRES) 0.1 MG tablet Take 0.1 mg by mouth 3 (three) times daily.    Historical Provider, MD  clopidogrel (PLAVIX) 75 MG tablet Take 75 mg by mouth daily.    Historical Provider, MD  diltiazem (CARDIZEM LA) 240 MG 24 hr tablet Take 1 tablet (240 mg total) by mouth daily. 03/21/15   Auburn Bilberry, MD  felodipine (PLENDIL) 10 MG 24 hr tablet Take 10 mg by mouth 2 (two) times daily.    Historical Provider, MD  Fluticasone-Salmeterol (ADVAIR) 250-50 MCG/DOSE AEPB Inhale 1 puff into the lungs every 12 (twelve) hours.  Historical Provider, MD  HYDROcodone-acetaminophen (NORCO/VICODIN) 5-325 MG tablet 1-2 tab po q 8 hours prn 11/12/15   Payton Mccallum, MD  methylPREDNISolone (MEDROL DOSEPAK) 4 MG TBPK tablet Take as per package instructions 11/30/15   Lutricia Feil, PA-C  metoprolol (LOPRESSOR) 100 MG tablet Take 100 mg by mouth 2 (two) times daily.    Historical Provider, MD  mycophenolate (MYFORTIC) 360 MG TBEC Take 360 mg by mouth 2 (two) times daily.    Historical Provider, MD  omeprazole (PRILOSEC OTC) 20 MG tablet Take 20 mg by mouth daily.    Historical Provider, MD  pravastatin (PRAVACHOL) 40 MG tablet Take 40 mg by mouth  daily.    Historical Provider, MD  predniSONE (DELTASONE) 20 MG tablet 3 tabs po qd for 2 days, then 2 tabs po qd for 3 days, then 1 tab po qd for 3 days, then half a tab po qd for 2 days 11/12/15   Payton Mccallum, MD  predniSONE (DELTASONE) 5 MG tablet Take 5 mg by mouth daily.    Historical Provider, MD  Rivaroxaban (XARELTO) 15 MG TABS tablet Take 1 tablet (15 mg total) by mouth daily with supper. 03/21/15   Auburn Bilberry, MD  tacrolimus (PROGRAF) 1 MG capsule Take 2 mg by mouth 2 (two) times daily.    Historical Provider, MD  tiotropium (SPIRIVA) 18 MCG inhalation capsule Place 18 mcg into inhaler and inhale daily.    Historical Provider, MD  torsemide (DEMADEX) 20 MG tablet Take 20 mg by mouth daily.    Historical Provider, MD   Meds Ordered and Administered this Visit  Medications - No data to display  BP 130/57 mmHg  Pulse 76  Temp(Src) 98 F (36.7 C) (Tympanic)  Resp 18  Wt 139 lb (63.05 kg)  SpO2 100% No data found.   Physical Exam  Constitutional: She is oriented to person, place, and time. She appears well-developed and well-nourished. No distress.  HENT:  Head: Normocephalic and atraumatic.  Eyes: Conjunctivae are normal. Pupils are equal, round, and reactive to light.  Neck: Normal range of motion. Neck supple.  Musculoskeletal: She exhibits edema and tenderness.  Examination of the right dominant hand and wrist shows swelling of the hand with warmth but no erythema. He has marked limitation of motion on extension and flexion on her radial deviation pronation supination. There is a bogginess to palpation of the wrist. Maximum tenderness seems to be over the first CM joint. However there is diffuse tenderness over the entire wrist. Fingers are swollen probably from dependency and nonuse. She is afebrile.  Neurological: She is alert and oriented to person, place, and time.  Skin: Skin is warm and dry. She is not diaphoretic.  Psychiatric: She has a normal mood and affect. Her  behavior is normal. Judgment and thought content normal.  Nursing note and vitals reviewed.   ED Course  Procedures (including critical care time)  Labs Review Labs Reviewed - No data to display  Imaging Review Dg Wrist Complete Right  11/30/2015  CLINICAL DATA:  Pain and swelling of the radial side right hand. EXAM: RIGHT WRIST - COMPLETE 3+ VIEW COMPARISON:  None. FINDINGS: There is advanced osteoarthritis of the first carpometacarpal articulation with chronic deformity of the multangular bones and subluxation of the base of the first metacarpal. I do not see any erosions to allow diagnosis of gout at this location. Small erosion within the capitate is nonspecific. 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 Regional arterial calcification is  noted. No acute fracture. IMPRESSION: Chronic osteoarthritis of the first carpometacarpal joint with deformity and subluxation. No finding specific for gout. Electronically Signed   By: Paulina FusiMark  Shogry M.D.   On: 11/30/2015 15:43     Visual Acuity Review  Right Eye Distance:   Left Eye Distance:   Bilateral Distance:    Right Eye Near:   Left Eye Near:    Bilateral Near:     She was given a wrist splint for support and comfort    MDM   1. Pseudogout of wrist, right    Discharge Medication List as of 11/30/2015  4:05 PM    START taking these medications   Details  methylPREDNISolone (MEDROL DOSEPAK) 4 MG TBPK tablet Take as per package instructions, Normal      Plan: 1. Test/x-ray results and diagnosis reviewed with patient 2. rx as per orders; risks, benefits, potential side effects reviewed with patient 3. Recommend supportive treatment with Elevation to control swelling and use of her fingers. I've explained to the patient and her daughter who accompanies her that she has pseudogout on x-ray today and by examination is confirmed. She's had a kidney transplant so is not able to take nonsteroidal anti-inflammatory medications or  colchicine so we were left with prednisone. I have told them that it would be best if she were under the care of a rheumatologist since she has severe arthritis and with her renal compromise condition the possibility of a septic arthritis is always present. She will require ongoing care for her wrist as it will not fully improve.I was Told that she has an appointment with her nephrologist in 5 days and I have recommended highly that they discuss this with him see if he would possibly refer them to a rheumatologist. In the interim we have given her  Medrol Dosepak for this episode and I've given her information regarding dietary choices and information on pseudogout.  4. F/u prn if symptoms worsen or don't improve     Lutricia FeilWilliam P Roemer, PA-C 11/30/15 2100

## 2015-11-30 NOTE — ED Notes (Signed)
Patient states she his having a gout attack in her right hand.

## 2015-12-26 NOTE — ED Provider Notes (Signed)
[facility]    CSN: [id number] Arrival date & time: 11/12/15  1501  First Provider Contact:  First MD Initiated Contact with Patient 11/12/15 1528        History   Chief Complaint Chief Complaint  Patient presents with  . Neck Injury  . Hand Pain    HPI Wendy Key is a 80 y.o. female.   The history is provided by the patient.  Neck Injury   Hand Pain   Neck pain for 1 week. Has used Tylenol and heating pad. RIght hand pain. Was seen by her PCP and told she had Gout. Was given prednisone. Hand is swollen and in pain. Has h/o gout. Denies any injury, fevers, chills.   Past Medical History:  Diagnosis Date  . Asthma 11/13/2011  . CAD (coronary artery disease) 11/13/2011   [facility]  . Diabetes mellitus type II 11/13/2011  . Gout 11/13/2011  . Hyperlipidemia 11/13/2011  . Hypertension 11/13/2011  . Peripheral vascular disease (HCC) 11/13/2011  . S/P kidney transplant 11/13/2011   [facility] Followed by Dr. Cecil CrankerEmily Arnold    Patient Active Problem List   Diagnosis Date Noted  . Symptomatic bradycardia 03/23/2015  . Atrial fibrillation with RVR (HCC) 03/17/2015  . Facial weakness 11/14/2011  . Hypertension 11/13/2011  . S/P kidney transplant 11/13/2011  . Hyperlipidemia 11/13/2011  . Diabetes mellitus type II 11/13/2011  . Asthma 11/13/2011  . Gout 11/13/2011  . CAD (coronary artery disease) 11/13/2011  . Peripheral vascular disease (HCC) 11/13/2011  . Syncope 11/13/2011  . Normocytic anemia 11/13/2011    Past Surgical History:  Procedure Laterality Date  . ABDOMINAL HYSTERECTOMY  1977   for fibroids  . AV FISTULA PLACEMENT     x2 with removal of left arm fistula  . [facility]IDNEY [facility]ANT  2005   [facility]  . PACEMAKER INSERTION N/A 03/27/2015   Procedure: INSERTION PACEMAKER;  Surgeon: Marcina MillardAlexander Paraschos, MD;  Location: [facility] ORS;  Service: Cardiovascular;  Laterality: N/A;    OB History      No data available       Home Medications    Prior to Admission medications   Medication Sig Start Date End Date Taking? Authorizing Provider  apixaban (ELIQUIS) 2.5 MG TABS tablet Take 2.5 mg by mouth 2 (two) times daily.   Yes Historical Provider, MD  aspirin 81 MG tablet Take 81 mg by mouth daily.   Yes Historical Provider, MD  Calcium Carbonate-Vitamin D (CALCIUM PLUS VITAMIN D PO) Take 1 tablet by mouth daily.   Yes Historical Provider, MD  felodipine (PLENDIL) 10 MG 24 hr tablet Take 10 mg by mouth 2 (two) times daily.   Yes Historical Provider, MD  Fluticasone-Salmeterol (ADVAIR) 250-50 MCG/DOSE AEPB Inhale 1 puff into the lungs every 12 (twelve) hours.   Yes Historical Provider, MD  metoprolol (LOPRESSOR) 100 MG tablet Take 100 mg by mouth 2 (two) times daily.   Yes Historical Provider, MD  omeprazole (PRILOSEC OTC) 20 MG tablet Take 20 mg by mouth daily.   Yes Historical Provider, MD  pravastatin (PRAVACHOL) 40 MG tablet Take 40 mg by mouth daily.   Yes Historical Provider, MD  tiotropium (SPIRIVA) 18 MCG inhalation capsule Place 18 mcg into inhaler and inhale daily.   Yes Historical Provider, MD  torsemide (DEMADEX) 20 MG tablet Take 20 mg by mouth daily.   Yes Historical Provider, MD  cephALEXin (KEFLEX) 500 MG  capsule Take 1 capsule (500 mg total) by mouth 4 (four) times daily. 03/29/15   Shaune Pollack, MD  chlorthalidone (HYGROTON) 25 MG tablet Take 25 mg by mouth daily.    Historical Provider, MD  cloNIDine (CATAPRES) 0.1 MG tablet Take 0.1 mg by mouth 3 (three) times daily.    Historical Provider, MD  clopidogrel (PLAVIX) 75 MG tablet Take 75 mg by mouth daily.    Historical Provider, MD  diltiazem (CARDIZEM LA) 240 MG 24 hr tablet Take 1 tablet (240 mg total) by mouth daily. 03/21/15   Auburn Bilberry, MD  HYDROcodone-acetaminophen (NORCO/VICODIN) 5-325 MG tablet 1-2 tab po q 8 hours prn 11/12/15   Payton Mccallum, MD  methylPREDNISolone (MEDROL DOSEPAK) 4 MG TBPK tablet Take as per  package instructions 11/30/15   Lutricia Feil, PA-C  mycophenolate (MYFORTIC) 360 MG TBEC Take 360 mg by mouth 2 (two) times daily.    Historical Provider, MD  predniSONE (DELTASONE) 20 MG tablet 3 tabs po qd for 2 days, then 2 tabs po qd for 3 days, then 1 tab po qd for 3 days, then half a tab po qd for 2 days 11/12/15   Payton Mccallum, MD  predniSONE (DELTASONE) 5 MG tablet Take 5 mg by mouth daily.    Historical Provider, MD  Rivaroxaban (XARELTO) 15 MG TABS tablet Take 1 tablet (15 mg total) by mouth daily with supper. 03/21/15   Auburn Bilberry, MD  tacrolimus (PROGRAF) 1 MG capsule Take 2 mg by mouth 2 (two) times daily.    Historical Provider, MD    Family History Family History  Problem Relation Age of Onset  . Diabetes Father   . Diabetes type II Son     Social History Social History  Substance Use Topics  . Smoking status: Never Smoker  . Smokeless tobacco: Never Used  . Alcohol use No     Allergies   Review of patient's allergies indicates no known allergies.   Review of Systems Review of Systems   Physical Exam Triage Vital Signs ED Triage Vitals  Enc Vitals Group     BP 11/12/15 1520 135/68     Pulse Rate 11/12/15 1520 (!) 102     Resp 11/12/15 1520 16     Temp 11/12/15 1520 98.9 F (37.2 C)     Temp src --      SpO2 11/12/15 1520 98 %     Weight 11/12/15 1520 146 lb (66.2 kg)     Height 11/12/15 1520 5\' 6"  (1.676 m)     Head Circumference --      Peak Flow --      Pain Score 11/12/15 1519 10     Pain Loc --      Pain Edu? --      Excl. in GC? --    No data found.   Updated Vital Signs BP 135/68   Pulse (!) 102   Temp 98.9 F (37.2 C)   Resp 16   Ht 5\' 6"  (1.676 m)   Wt 146 lb (66.2 kg)   SpO2 98%   BMI 23.57 kg/m   Visual Acuity Right Eye Distance:   Left Eye Distance:   Bilateral Distance:    Right Eye Near:   Left Eye Near:    Bilateral Near:     Physical Exam  Constitutional: She appears well-developed and well-nourished. No  distress.  HENT:  Head: Normocephalic and atraumatic.  Right Ear: External ear normal.  Left Ear: External  ear normal.  Eyes: EOM are normal. Pupils are equal, round, and reactive to light.  Neck: Normal range of motion. Neck supple.  Musculoskeletal: She exhibits tenderness.       Cervical back: She exhibits tenderness (paraspinous muscles) and spasm. She exhibits normal range of motion, no bony tenderness, no swelling, no edema, no deformity, no laceration, no pain and normal pulse.       Right hand: She exhibits tenderness and swelling (mild).  Skin: She is not diaphoretic.  Nursing note and vitals reviewed.    UC Treatments / Results  Labs (all labs ordered are listed, but only abnormal results are displayed) Labs Reviewed - No data to display  EKG  EKG Interpretation None       Radiology No results found.  Procedures Procedures (including critical care time)  Medications Ordered in UC Medications - No data to display   Initial Impression / Assessment and Plan / UC Course  I have reviewed the triage vital signs and the nursing notes.  Pertinent labs & imaging results that were available during my care of the patient were reviewed by me and considered in my medical decision making (see chart for details).  Clinical Course      Final Clinical Impressions(s) / UC Diagnoses   Final diagnoses:  Acute idiopathic gout, unspecified site  Neck pain  Neck strain, initial encounter    New Prescriptions Discharge Medication List as of 11/12/2015  3:45 PM    START taking these medications   Details  HYDROcodone-acetaminophen (NORCO/VICODIN) 5-325 MG tablet 1-2 tab po q 8 hours prn, Print    !! predniSONE (DELTASONE) 20 MG tablet 3 tabs po qd for 2 days, then 2 tabs po qd for 3 days, then 1 tab po qd for 3 days, then half a tab po qd for 2 days, Normal     !! - Potential duplicate medications found. Please discuss with provider.     1. diagnosis reviewed with  patient 2. rx as per orders above; reviewed possible side effects, interactions, risks and benefits  3. Recommend supportive treatment with otc tylenol prn 4. Follow-up prn if symptoms worsen or don't improve   Payton Mccallumrlando Suhan Paci, MD 12/26/15 [phone]

## 2016-01-10 ENCOUNTER — Encounter: Payer: Self-pay | Admitting: *Deleted

## 2016-01-10 ENCOUNTER — Ambulatory Visit
Admission: EM | Admit: 2016-01-10 | Discharge: 2016-01-10 | Payer: Medicare Other | Attending: Family Medicine | Admitting: Family Medicine

## 2016-01-10 DIAGNOSIS — Z7982 Long term (current) use of aspirin: Secondary | ICD-10-CM | POA: Diagnosis not present

## 2016-01-10 DIAGNOSIS — Z7901 Long term (current) use of anticoagulants: Secondary | ICD-10-CM | POA: Insufficient documentation

## 2016-01-10 DIAGNOSIS — Z79899 Other long term (current) drug therapy: Secondary | ICD-10-CM | POA: Insufficient documentation

## 2016-01-10 DIAGNOSIS — I739 Peripheral vascular disease, unspecified: Secondary | ICD-10-CM | POA: Diagnosis not present

## 2016-01-10 DIAGNOSIS — R739 Hyperglycemia, unspecified: Secondary | ICD-10-CM | POA: Diagnosis not present

## 2016-01-10 DIAGNOSIS — E785 Hyperlipidemia, unspecified: Secondary | ICD-10-CM | POA: Insufficient documentation

## 2016-01-10 DIAGNOSIS — J45909 Unspecified asthma, uncomplicated: Secondary | ICD-10-CM | POA: Diagnosis not present

## 2016-01-10 DIAGNOSIS — I2510 Atherosclerotic heart disease of native coronary artery without angina pectoris: Secondary | ICD-10-CM | POA: Insufficient documentation

## 2016-01-10 DIAGNOSIS — E1165 Type 2 diabetes mellitus with hyperglycemia: Secondary | ICD-10-CM | POA: Diagnosis present

## 2016-01-10 DIAGNOSIS — I10 Essential (primary) hypertension: Secondary | ICD-10-CM | POA: Insufficient documentation

## 2016-01-10 LAB — GLUCOSE, CAPILLARY: Glucose-Capillary: 551 mg/dL (ref 65–99)

## 2016-01-10 NOTE — ED Triage Notes (Signed)
Patient started having symptoms of dizziness and weakness yesterday. Today at 1800 her daughter checked the patient's blood glucose which was in excess of 500.

## 2016-02-25 NOTE — ED Provider Notes (Signed)
[facility]    CSN: [id number] Arrival date & time: 01/10/16  1937     History   Chief Complaint Chief Complaint  Patient presents with  . Dizziness  . Blood Sugar Problem    HPI Wendy Key is a 80 y.o. female.   80 yo female with a c/o intermittent dizziness and high blood sugars. Denies any numbness/tingling, slurred speech, vision changes, trouble swallowing, one-sided weakness. Not taking meds for diabetes.   The history is provided by the patient.  Dizziness    Past Medical History:  Diagnosis Date  . Asthma 11/13/2011  . CAD (coronary artery disease) 11/13/2011   50% LAD lesion [facility]  . Diabetes mellitus type II 11/13/2011  . Gout 11/13/2011  . Hyperlipidemia 11/13/2011  . Hypertension 11/13/2011  . Peripheral vascular disease (HCC) 11/13/2011  . S/P kidney transplant 11/13/2011   cadevaric transplant  2005 at [facility] Followed by Dr. Cecil Cranker    Patient Active Problem List   Diagnosis Date Noted  . Symptomatic bradycardia 03/23/2015  . Atrial fibrillation with RVR (HCC) 03/17/2015  . Facial weakness 11/14/2011  . Hypertension 11/13/2011  . S/P kidney transplant 11/13/2011  . Hyperlipidemia 11/13/2011  . Diabetes mellitus type II 11/13/2011  . Asthma 11/13/2011  . Gout 11/13/2011  . CAD (coronary artery disease) 11/13/2011  . Peripheral vascular disease (HCC) 11/13/2011  . Syncope 11/13/2011  . Normocytic anemia 11/13/2011    Past Surgical History:  Procedure Laterality Date  . ABDOMINAL HYSTERECTOMY  1977   for fibroids  . AV FISTULA PLACEMENT     x2 with removal of left arm fistula  . KIDNEY TRANSPLANT  2005   At [facility]  . PACEMAKER INSERTION N/A 03/27/2015   Procedure: INSERTION PACEMAKER;  Surgeon: Marcina Millard, MD;  Location: [facility] ORS;  Service: Cardiovascular;  Laterality: N/A;    OB History    No data available       Home Medications    Prior to Admission medications   Medication Sig  Start Date End Date Taking? Authorizing Provider  apixaban (ELIQUIS) 2.5 MG TABS tablet Take 2.5 mg by mouth 2 (two) times daily.   Yes Historical Provider, MD  aspirin 81 MG tablet Take 81 mg by mouth daily.   Yes Historical Provider, MD  Calcium Carbonate-Vitamin D (CALCIUM PLUS VITAMIN D PO) Take 1 tablet by mouth daily.   Yes Historical Provider, MD  felodipine (PLENDIL) 10 MG 24 hr tablet Take 10 mg by mouth 2 (two) times daily.   Yes Historical Provider, MD  metoprolol (LOPRESSOR) 100 MG tablet Take 100 mg by mouth 2 (two) times daily.   Yes Historical Provider, MD  mycophenolate (MYFORTIC) 360 MG TBEC Take 360 mg by mouth 2 (two) times daily.   Yes Historical Provider, MD  omeprazole (PRILOSEC OTC) 20 MG tablet Take 20 mg by mouth daily.   Yes Historical Provider, MD  pravastatin (PRAVACHOL) 40 MG tablet Take 40 mg by mouth daily.   Yes Historical Provider, MD  predniSONE (DELTASONE) 5 MG tablet Take 5 mg by mouth daily.   Yes Historical Provider, MD  tiotropium (SPIRIVA) 18 MCG inhalation capsule Place 18 mcg into inhaler and inhale daily.   Yes Historical Provider, MD  torsemide (DEMADEX) 20 MG tablet Take 20 mg by mouth daily.   Yes Historical Provider, MD  cephALEXin (KEFLEX) 500 MG capsule Take 1 capsule (500 mg total) by mouth 4 (four) times daily. 03/29/15   Qing  Imogene Burn, MD  chlorthalidone (HYGROTON) 25 MG tablet Take 25 mg by mouth daily.    Historical Provider, MD  cloNIDine (CATAPRES) 0.1 MG tablet Take 0.1 mg by mouth 3 (three) times daily.    Historical Provider, MD  clopidogrel (PLAVIX) 75 MG tablet Take 75 mg by mouth daily.    Historical Provider, MD  diltiazem (CARDIZEM LA) 240 MG 24 hr tablet Take 1 tablet (240 mg total) by mouth daily. 03/21/15   Auburn Bilberry, MD  Fluticasone-Salmeterol (ADVAIR) 250-50 MCG/DOSE AEPB Inhale 1 puff into the lungs every 12 (twelve) hours.    Historical Provider, MD  HYDROcodone-acetaminophen (NORCO/VICODIN) 5-325 MG tablet 1-2 tab po q 8 hours  prn 11/12/15   Payton Mccallum, MD  methylPREDNISolone (MEDROL DOSEPAK) 4 MG TBPK tablet Take as per package instructions 11/30/15   Lutricia Feil, PA-C  predniSONE (DELTASONE) 20 MG tablet 3 tabs po qd for 2 days, then 2 tabs po qd for 3 days, then 1 tab po qd for 3 days, then half a tab po qd for 2 days 11/12/15   Payton Mccallum, MD  Rivaroxaban (XARELTO) 15 MG TABS tablet Take 1 tablet (15 mg total) by mouth daily with supper. 03/21/15   Auburn Bilberry, MD  tacrolimus (PROGRAF) 1 MG capsule Take 2 mg by mouth 2 (two) times daily.    Historical Provider, MD    Family History Family History  Problem Relation Age of Onset  . Diabetes Father   . Diabetes type II Son     Social History Social History  Substance Use Topics  . Smoking status: Never Smoker  . Smokeless tobacco: Never Used  . Alcohol use No     Allergies   Review of patient's allergies indicates no known allergies.   Review of Systems Review of Systems  Neurological: Positive for dizziness.     Physical Exam Triage Vital Signs ED Triage Vitals  Enc Vitals Group     BP 01/10/16 1948 136/66     Pulse Rate 01/10/16 1948 94     Resp 01/10/16 1948 18     Temp 01/10/16 1948 98.1 F (36.7 C)     Temp Source 01/10/16 1948 Oral     SpO2 01/10/16 1948 97 %     Weight 01/10/16 1949 139 lb (63 kg)     Height 01/10/16 1949 5\' 6"  (1.676 m)     Head Circumference --      Peak Flow --      Pain Score 01/10/16 2000 0     Pain Loc --      Pain Edu? --      Excl. in GC? --    No data found.   Updated Vital Signs BP 136/66 (BP Location: Left Arm)   Pulse 94   Temp 98.1 F (36.7 C) (Oral)   Resp 18   Ht 5\' 6"  (1.676 m)   Wt 139 lb (63 kg)   SpO2 97%   BMI 22.44 kg/m   Visual Acuity Right Eye Distance:   Left Eye Distance:   Bilateral Distance:    Right Eye Near:   Left Eye Near:    Bilateral Near:     Physical Exam  Constitutional: She is oriented to person, place, and time. She appears well-developed and  well-nourished. No distress.  HENT:  Head: Normocephalic.  Right Ear: Tympanic membrane, external ear and ear canal normal.  Left Ear: Tympanic membrane, external ear and ear canal normal.  Nose: Nose normal.  Mouth/Throat: Oropharynx is clear and moist and mucous membranes are normal.  Eyes: Conjunctivae and EOM are normal. Pupils are equal, round, and reactive to light. Right eye exhibits no discharge. Left eye exhibits no discharge. No scleral icterus.  Neck: Normal range of motion. Neck supple. No JVD present. No tracheal deviation present. No thyromegaly present.  Cardiovascular: Normal rate, regular rhythm, normal heart sounds and intact distal pulses.   No murmur heard. Pulmonary/Chest: Effort normal and breath sounds normal. No stridor. No respiratory distress. She has no wheezes. She has no rales. She exhibits no tenderness.  Abdominal: Soft. Bowel sounds are normal. She exhibits no distension and no mass. There is no tenderness. There is no rebound and no guarding.  Musculoskeletal: She exhibits no edema or tenderness.  Lymphadenopathy:    She has no cervical adenopathy.  Neurological: She is alert and oriented to person, place, and time. She has normal reflexes. She displays normal reflexes. No cranial nerve deficit. She exhibits normal muscle tone. Coordination normal.  Skin: Skin is warm and dry. No rash noted. She is not diaphoretic. No erythema. No pallor.  Psychiatric: She has a normal mood and affect. Her behavior is normal. Judgment and thought content normal.  Vitals reviewed.    UC Treatments / Results  Labs (all labs ordered are listed, but only abnormal results are displayed) Labs Reviewed  GLUCOSE, CAPILLARY - Abnormal; Notable for the following:       Result Value   Glucose-Capillary 551 (*)    All other components within normal limits    EKG  EKG Interpretation None       Radiology No results found.  Procedures Procedures (including critical care  time)  Medications Ordered in UC Medications - No data to display   Initial Impression / Assessment and Plan / UC Course  I have reviewed the triage vital signs and the nursing notes.  Pertinent labs & imaging results that were available during my care of the patient were reviewed by me and considered in my medical decision making (see chart for details).  Clinical Course      Final Clinical Impressions(s) / UC Diagnoses   Final diagnoses:  Hyperglycemia    New Prescriptions Discharge Medication List as of 01/10/2016  8:13 PM     1. Lab result and diagnosis reviewed with patient; recommend patient go to ED for further management of hyperglycemia. Patient in stable condition, refuses EMS and states will be transported to ED in private vehicle.    Payton Mccallumrlando Jaelee Laughter, MD 02/25/16 1800

## 2016-02-27 ENCOUNTER — Other Ambulatory Visit
Admission: RE | Admit: 2016-02-27 | Discharge: 2016-02-27 | Disposition: A | Discharge status: Home / Self Care | Payer: No Typology Code available for payment source | Source: Ambulatory Visit | Attending: Family Medicine | Admitting: Family Medicine

## 2016-02-27 DIAGNOSIS — R05 Cough: Secondary | ICD-10-CM | POA: Diagnosis present

## 2016-02-29 LAB — LEGIONELLA PNEUMOPHILA SEROGP 1 UR AG: L. PNEUMOPHILA SEROGP 1 UR AG: NEGATIVE

## 2016-08-04 IMAGING — CR DG CHEST 1V PORT
1 series · 1 of 1 positions shown · non-contrast
Comparison: Chest x-ray dated 03/17/2015

CLINICAL DATA: Pacemaker insertion.

FLUOROSCOPY TIME:  C-arm fluoroscopic images were obtained
intraoperatively and submitted for post operative interpretation.
Please see the performing provider's procedural report for the
fluoroscopy time utilized.
EXAM:
PORTABLE CHEST 1 VIEW, C-arm 61-120 minutes.

[ap]
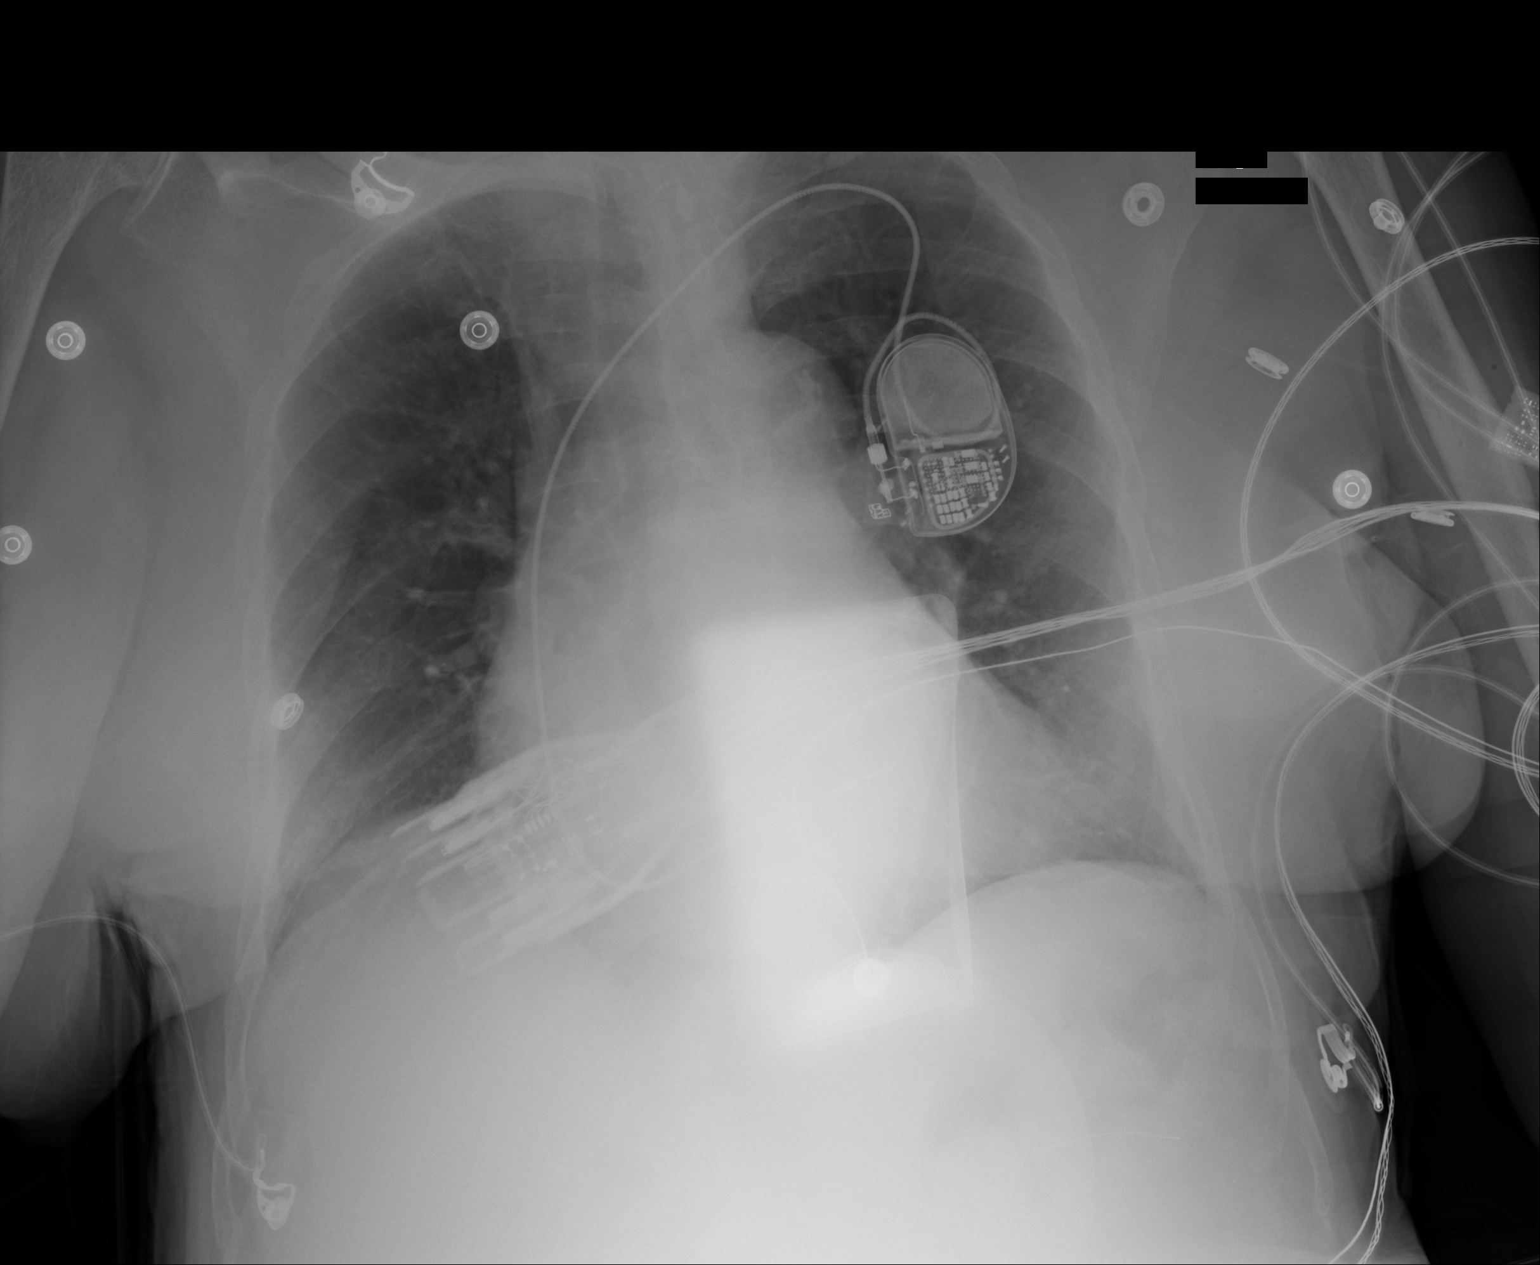

[1 of 1 positions shown; findings below may reference images not displayed]

FINDINGS: Single lead pacemaker is been inserted. No pneumothorax. Heart size
and vascularity are normal and the lungs are clear. No acute osseous
abnormality. Severe degenerative changes at the right shoulder.
Aortic atherosclerosis.
IMPRESSION: Satisfactory appearance of the chest after pacemaker insertion.
Aortic atherosclerosis.

## 2016-08-09 ENCOUNTER — Ambulatory Visit
Admission: EM | Admit: 2016-08-09 | Discharge: 2016-08-09 | Disposition: A | Discharge status: Home / Self Care | Payer: Medicare Other | Attending: Family Medicine | Admitting: Family Medicine

## 2016-08-09 DIAGNOSIS — R002 Palpitations: Secondary | ICD-10-CM

## 2016-08-09 DIAGNOSIS — R0602 Shortness of breath: Secondary | ICD-10-CM

## 2016-08-09 NOTE — ED Triage Notes (Signed)
Patient states that she woke up this morning and felt like her heart was racing. Patient has a pacemaker and she thinks may have helped. Patient states that her blood pressure was elevated but she is unsure of what it was. Patient states that she feel tired and short of breath. Patient denies any chest pain.

## 2016-08-09 NOTE — Discharge Instructions (Signed)
Go to emergency room as discussed.

## 2016-08-09 NOTE — ED Provider Notes (Signed)
[facility] ____________________________________________  Time seen: Approximately 0950  I have reviewed the triage vital signs and the nursing notes.   HISTORY  Chief Complaint Hypertension  HPI Wendy Key is a 81 y.o. female with past medical history of CAD, Parkinson's, diabetes, hypertension, hyperlipidemia, left renal transplant and peripheral vascular disease presenting with her son at bedside for evaluation heart racing and feeling that woke her up at 5 AM this morning. Patient states she felt like her heart was beating very fast immediate filling she cannot catch her breath and that this occurred at 5 AM waking her from her sleep. Denies triggers.Patient reports that she then got up and took her blood pressure medicine and after about an hour if things improved and then resolved. Reports total heart beating fast sensation lasted about 2 hours. States currently she feels just somewhat tired. Denies any accompanying chest pain. Denies any current palpitation type sensation. Reports has had a similar years ago with her atrial fibrillation, but denies recent. Reports she does have a pacemaker after symptomatic bradycardia episodes. Reports pacemaker is placed last year at [facility].  Denies chest pain, current shortness of breath, abdominal pain, dysuria, extremity pain, extremity swelling or rash. Denies recent sickness. Denies recent antibiotic use.   [facility]: PCP cardiology: Paraschos   Past Medical History:  Diagnosis Date  . Asthma 11/13/2011  . CAD (coronary artery disease) 11/13/2011   50% LAD lesion diagnosed at [facility]  . Diabetes mellitus type II 11/13/2011  . Gout 11/13/2011  . Hyperlipidemia 11/13/2011  . Hypertension 11/13/2011  . Peripheral vascular disease (HCC) 11/13/2011  . S/P kidney transplant 11/13/2011   cadevaric transplant  2005 at [facility] Followed by Dr. Cecil Cranker    Patient Active Problem List   Diagnosis Date Noted  .  Symptomatic bradycardia 03/23/2015  . Atrial fibrillation with RVR (HCC) 03/17/2015  . Facial weakness 11/14/2011  . Hypertension 11/13/2011  . S/P kidney transplant 11/13/2011  . Hyperlipidemia 11/13/2011  . Diabetes mellitus type II 11/13/2011  . Asthma 11/13/2011  . Gout 11/13/2011  . CAD (coronary artery disease) 11/13/2011  . Peripheral vascular disease (HCC) 11/13/2011  . Syncope 11/13/2011  . Normocytic anemia 11/13/2011    Past Surgical History:  Procedure Laterality Date  . ABDOMINAL HYSTERECTOMY  1977   for fibroids  . AV FISTULA PLACEMENT     x2 with removal of left arm fistula  . KIDNEY TRANSPLANT  2005   At [facility]  . PACEMAKER INSERTION N/A 03/27/2015   Procedure: INSERTION PACEMAKER;  Surgeon: Marcina Millard, MD;  Location: [facility] ORS;  Service: Cardiovascular;  Laterality: N/A;     No current facility-administered medications for this encounter.   Current Outpatient Prescriptions:  .  allopurinol (ZYLOPRIM) 100 MG tablet, Take 100 mg by mouth daily., Disp: , Rfl:  .  apixaban (ELIQUIS) 2.5 MG TABS tablet, Take 2.5 mg by mouth 2 (two) times daily., Disp: , Rfl:  .  aspirin 81 MG tablet, Take 81 mg by mouth daily., Disp: , Rfl:  .  Calcium Carbonate-Vitamin D (CALCIUM PLUS VITAMIN D PO), Take 1 tablet by mouth daily., Disp: , Rfl:  .  carbidopa-levodopa (SINEMET IR) 25-100 MG tablet, Take 1 tablet by mouth 2 (two) times daily., Disp: , Rfl:  .  felodipine (PLENDIL) 10 MG 24 hr tablet, Take 10 mg by mouth 2 (two) times daily., Disp: , Rfl:  .  gabapentin (NEURONTIN) 300 MG capsule, Take 300 mg by mouth 2 (  two) times daily., Disp: , Rfl:  .  insulin NPH-regular Human (NOVOLIN 70/30) (70-30) 100 UNIT/ML injection, Inject 12 Units into the skin., Disp: , Rfl:  .  metoprolol (LOPRESSOR) 100 MG tablet, Take 100 mg by mouth 2 (two) times daily., Disp: , Rfl:  .  mycophenolate (MYFORTIC) 360 MG TBEC, Take 360 mg by mouth 2 (two) times daily., Disp: , Rfl:  .   omeprazole (PRILOSEC OTC) 20 MG tablet, Take 20 mg by mouth daily., Disp: , Rfl:  .  pravastatin (PRAVACHOL) 40 MG tablet, Take 40 mg by mouth daily., Disp: , Rfl:  .  predniSONE (DELTASONE) 5 MG tablet, Take 5 mg by mouth daily., Disp: , Rfl:  .  tacrolimus (PROGRAF) 1 MG capsule, Take 2 mg by mouth 2 (two) times daily., Disp: , Rfl:  .  tiotropium (SPIRIVA) 18 MCG inhalation capsule, Place 18 mcg into inhaler and inhale daily., Disp: , Rfl:  .  torsemide (DEMADEX) 20 MG tablet, Take 20 mg by mouth daily., Disp: , Rfl:  .  cephALEXin (KEFLEX) 500 MG capsule, Take 1 capsule (500 mg total) by mouth 4 (four) times daily., Disp: 28 capsule, Rfl: 0 .  chlorthalidone (HYGROTON) 25 MG tablet, Take 25 mg by mouth daily., Disp: , Rfl:  .  cloNIDine (CATAPRES) 0.1 MG tablet, Take 0.1 mg by mouth 3 (three) times daily., Disp: , Rfl:  .  clopidogrel (PLAVIX) 75 MG tablet, Take 75 mg by mouth daily., Disp: , Rfl:  .  diltiazem (CARDIZEM LA) 240 MG 24 hr tablet, Take 1 tablet (240 mg total) by mouth daily., Disp: 30 tablet, Rfl: 0 .  Fluticasone-Salmeterol (ADVAIR) 250-50 MCG/DOSE AEPB, Inhale 1 puff into the lungs every 12 (twelve) hours., Disp: , Rfl:  .  HYDROcodone-acetaminophen (NORCO/VICODIN) 5-325 MG tablet, 1-2 tab po q 8 hours prn, Disp: 10 tablet, Rfl: 0 .  methylPREDNISolone (MEDROL DOSEPAK) 4 MG TBPK tablet, Take as per package instructions, Disp: 21 tablet, Rfl: 0 .  predniSONE (DELTASONE) 20 MG tablet, 3 tabs po qd for 2 days, then 2 tabs po qd for 3 days, then 1 tab po qd for 3 days, then half a tab po qd for 2 days, Disp: 16 tablet, Rfl: 0 .  Rivaroxaban (XARELTO) 15 MG TABS tablet, Take 1 tablet (15 mg total) by mouth daily with supper., Disp: 30 tablet, Rfl: 30  Allergies Patient has no known allergies.  Family History  Problem Relation Age of Onset  . Diabetes Father   . Diabetes type II Son     Social History Social History  Substance Use Topics  . Smoking status: Never Smoker  .  Smokeless tobacco: Never Used  . Alcohol use No    Review of Systems Constitutional: No fever/chills Eyes: No visual changes. ENT: No sore throat. Cardiovascular: Denies chest pain. Respiratory: As above. Gastrointestinal: No abdominal pain.  No nausea, no vomiting.  No diarrhea.  Reports intermittent constipation consistent with her chronic. Genitourinary: Negative for dysuria. Musculoskeletal: Negative for back pain. Skin: Negative for rash. Neurological: Negative for headaches, focal weakness or numbness.   ____________________________________________   PHYSICAL EXAM:  VITAL SIGNS: ED Triage Vitals  Enc Vitals Group     BP 08/09/16 0933 (!) 159/74     Pulse Rate 08/09/16 0933 77     Resp 08/09/16 0933 16     Temp 08/09/16 0933 98.4 F (36.9 C)     Temp Source 08/09/16 0933 Oral     SpO2 08/09/16 0933 98 %  Weight 08/09/16 0929 136 lb (61.7 kg)     Height 08/09/16 0929  (1.676 m)     Head Circumference --      Peak Flow --      Pain Score 08/09/16 0929 0     Pain Loc --      Pain Edu? --      Excl. in GC? --     Constitutional: Alert and oriented. Well appearing and in no acute distress. Eyes: Conjunctivae are normal. PERRL. EOMI. ENT      Head: Normocephalic and atraumatic      Mouth/Throat: Mucous membranes are moist.Oropharynx non-erythematous. Cardiovascular: Normal rate, regular rhythm. Grossly normal heart sounds.  Good peripheral circulation. Respiratory: Normal respiratory effort without tachypnea nor retractions. Breath sounds are clear and equal bilaterally. No wheezes, rales, rhonchi. Gastrointestinal: Soft and nontender. No distention. No CVA tenderness. Musculoskeletal: No midline cervical, thoracic or lumbar tenderness to palpation. Distal radial pulses and dosalis pedis palpated and equal bilaterally.      Right lower leg:  No tenderness or edema.      Left lower leg:  No tenderness or edema.  Neurologic:  Normal speech and language.  Speech is normal. No gait instability. Sensation intact bilaterally to face, upper and lower extremities. Steady gait.  Skin:  Skin is warm, dry and intact. No rash noted. Psychiatric: Mood and affect are normal. Speech and behavior are normal. Patient exhibits appropriate insight and judgment   ___________________________________________   LABS (all labs ordered are listed, but only abnormal results are displayed)  Labs Reviewed - No data to display ____________________________________________  EKG  ED ECG REPORT I, Renford Dills, the attending provider, personally viewed and interpreted this ECG.   Date: 08/09/2016  EKG Time: 0929  Rate: 77  Rhythm: normal sinus rhythm  Axis: normal  Intervals:none  ST&T Change: no ST or T wave depression or elevation noted.  ____________________________________________  RADIOLOGY  No results found. ____________________________________________   PROCEDURES Procedures    INITIAL IMPRESSION / ASSESSMENT AND PLAN / ED COURSE  Pertinent labs & imaging results that were available during my care of the patient were reviewed by me and considered in my medical decision making (see chart for details).  Well appearing patient, no acute distress. Discussed in detail with patient and son at bedside, recommend further evaluation in emergency room including cardiac enzymes, at this time. Dicussed with patient, appears stable, and possible a.fib this morning, but recommend further cardiac evaluation at this time as she reports this is atypical to her. Patient alert and oriented with decisional capacity, and reports her son will drive her to [facility]. [facility] ER called by Efraim Kaufmann CMA and given report. Patient stable at time of discharge.   ____________________________________________   FINAL CLINICAL IMPRESSION(S) / ED DIAGNOSES  Final diagnoses:  Palpitations     Discharge Medication List as of 08/09/2016 10:09 AM      Note: This dictation was  prepared with Dragon dictation along with smaller phrase technology. Any transcriptional errors that result from this process are unintentional.         Renford Dills, NP 08/09/16 1259

## 2016-10-22 ENCOUNTER — Ambulatory Visit
Admission: EM | Admit: 2016-10-22 | Discharge: 2016-10-22 | Disposition: A | Discharge status: Home / Self Care | Payer: Medicare Other | Attending: Family Medicine | Admitting: Family Medicine

## 2016-10-22 ENCOUNTER — Encounter: Payer: Self-pay | Admitting: Emergency Medicine

## 2016-10-22 DIAGNOSIS — L03032 Cellulitis of left toe: Secondary | ICD-10-CM | POA: Diagnosis not present

## 2016-10-22 MED ORDER — DOXYCYCLINE HYCLATE 100 MG PO TABS: 100.0000 mg | ORAL_TABLET | Freq: Two times a day (BID) | ORAL | 0 refills | Status: AC

## 2016-10-22 MED ORDER — DOXYCYCLINE HYCLATE 100 MG PO TABS
200.0000 mg | ORAL_TABLET | Freq: Once | ORAL | Status: AC
  Administered 2016-10-22: 200 mg via ORAL

## 2016-10-22 NOTE — ED Triage Notes (Signed)
  Patient c/o pain in her left 1st toe that started this morning.  Patient has history of gout. Patient denies injury or fall.

## 2016-10-22 NOTE — ED Provider Notes (Signed)
[facility]    CSN: [id number] Arrival date & time: 10/22/16  1837     History   Chief Complaint Chief Complaint  Patient presents with  . Toe Pain    HPI Wendy Key is a 81 y.o. female.   81 yo female with a c/o left big toe pain, redness and swelling. Has a h/o gout, however states this feels different than prior gout episodes.    The history is provided by the patient.  Toe Pain  This is a new problem.    Past Medical History:  Diagnosis Date  . Asthma 11/13/2011  . CAD (coronary artery disease) 11/13/2011   50% LAD lesion diagnosed at [facility]  . Diabetes mellitus type II 11/13/2011  . Gout 11/13/2011  . Hyperlipidemia 11/13/2011  . Hypertension 11/13/2011  . Peripheral vascular disease (HCC) 11/13/2011  . S/P kidney transplant 11/13/2011   cadevaric transplant  2005 at [facility] Followed by Dr. Cecil Cranker    Patient Active Problem List   Diagnosis Date Noted  . Symptomatic bradycardia 03/23/2015  . Atrial fibrillation with RVR (HCC) 03/17/2015  . Facial weakness 11/14/2011  . Hypertension 11/13/2011  . S/P kidney transplant 11/13/2011  . Hyperlipidemia 11/13/2011  . Diabetes mellitus type II 11/13/2011  . Asthma 11/13/2011  . Gout 11/13/2011  . CAD (coronary artery disease) 11/13/2011  . Peripheral vascular disease (HCC) 11/13/2011  . Syncope 11/13/2011  . Normocytic anemia 11/13/2011    Past Surgical History:  Procedure Laterality Date  . ABDOMINAL HYSTERECTOMY  1977   for fibroids  . AV FISTULA PLACEMENT     x2 with removal of left arm fistula  . KIDNEY TRANSPLANT  2005   At [facility]  . PACEMAKER INSERTION N/A 03/27/2015   Procedure: INSERTION PACEMAKER;  Surgeon: Marcina Millard, MD;  Location: [facility] ORS;  Service: Cardiovascular;  Laterality: N/A;    OB History    No data available       Home Medications    Prior to Admission medications   Medication Sig Start Date End Date Taking? Authorizing Provider    allopurinol (ZYLOPRIM) 100 MG tablet Take 100 mg by mouth daily.    [provider]  apixaban (ELIQUIS) 2.5 MG TABS tablet Take 2.5 mg by mouth 2 (two) times daily.    [provider]  aspirin 81 MG tablet Take 81 mg by mouth daily.    [provider]  Calcium Carbonate-Vitamin D (CALCIUM PLUS VITAMIN D PO) Take 1 tablet by mouth daily.    [provider]  carbidopa-levodopa (SINEMET IR) 25-100 MG tablet Take 1 tablet by mouth 2 (two) times daily.    [provider]  cephALEXin (KEFLEX) 500 MG capsule Take 1 capsule (500 mg total) by mouth 4 (four) times daily. 03/29/15   Shaune Pollack, MD  chlorthalidone (HYGROTON) 25 MG tablet Take 25 mg by mouth daily.    [provider]  cloNIDine (CATAPRES) 0.1 MG tablet Take 0.1 mg by mouth 3 (three) times daily.    [provider]  clopidogrel (PLAVIX) 75 MG tablet Take 75 mg by mouth daily.    [provider]  diltiazem (CARDIZEM LA) 240 MG 24 hr tablet Take 1 tablet (240 mg total) by mouth daily. 03/21/15   Auburn Bilberry, MD  doxycycline (VIBRA-TABS) 100 MG tablet Take 1 tablet (100 mg total) by mouth 2 (two) times daily. 10/22/16   Payton Mccallum, MD  felodipine (PLENDIL) 10 MG 24 hr tablet Take  10 mg by mouth 2 (two) times daily.    [provider]  Fluticasone-Salmeterol (ADVAIR) 250-50 MCG/DOSE AEPB Inhale 1 puff into the lungs every 12 (twelve) hours.    [provider]  gabapentin (NEURONTIN) 300 MG capsule Take 300 mg by mouth 2 (two) times daily.    [provider]  HYDROcodone-acetaminophen (NORCO/VICODIN) 5-325 MG tablet 1-2 tab po q 8 hours prn 11/12/15   Payton Mccallumonty, Lorilee Cafarella, MD  insulin NPH-regular Human (NOVOLIN 70/30) (70-30) 100 UNIT/ML injection Inject 12 Units into the skin.    [provider]  methylPREDNISolone (MEDROL DOSEPAK) 4 MG TBPK tablet Take as per package instructions 11/30/15   Lutricia Feiloemer, William P, PA-C  metoprolol (LOPRESSOR) 100 MG  tablet Take 100 mg by mouth 2 (two) times daily.    [provider]  mycophenolate (MYFORTIC) 360 MG TBEC Take 360 mg by mouth 2 (two) times daily.    [provider]  omeprazole (PRILOSEC OTC) 20 MG tablet Take 20 mg by mouth daily.    [provider]  pravastatin (PRAVACHOL) 40 MG tablet Take 40 mg by mouth daily.    [provider]  predniSONE (DELTASONE) 20 MG tablet 3 tabs po qd for 2 days, then 2 tabs po qd for 3 days, then 1 tab po qd for 3 days, then half a tab po qd for 2 days 11/12/15   Payton Mccallumonty, Anayely Constantine, MD  predniSONE (DELTASONE) 5 MG tablet Take 5 mg by mouth daily.    [provider]  Rivaroxaban (XARELTO) 15 MG TABS tablet Take 1 tablet (15 mg total) by mouth daily with supper. 03/21/15   Auburn BilberryPatel, Shreyang, MD  tacrolimus (PROGRAF) 1 MG capsule Take 2 mg by mouth 2 (two) times daily.    [provider]  tiotropium (SPIRIVA) 18 MCG inhalation capsule Place 18 mcg into inhaler and inhale daily.    [provider]  torsemide (DEMADEX) 20 MG tablet Take 20 mg by mouth daily.    [provider]    Family History Family History  Problem Relation Age of Onset  . Diabetes Father   . Diabetes type II Son     Social History Social History  Substance Use Topics  . Smoking status: Never Smoker  . Smokeless tobacco: Never Used  . Alcohol use No     Allergies   Patient has no known allergies.   Review of Systems Review of Systems   Physical Exam Triage Vital Signs ED Triage Vitals  Enc Vitals Group     BP 10/22/16 1957 (S) (!) 217/100     Pulse Rate 10/22/16 1957 80     Resp 10/22/16 1957 16     Temp 10/22/16 1957 98.5 F (36.9 C)     Temp Source 10/22/16 1957 Oral     SpO2 10/22/16 1957 100 %     Weight 10/22/16 1956 136 lb (61.7 kg)     Height 10/22/16 1956 5\' 6"  (1.676 m)     Head Circumference --      Peak Flow --      Pain Score 10/22/16 1956 7     Pain Loc --      Pain Edu? --      Excl. in  GC? --    No data found.   Updated Vital Signs BP (!) 180/90 (BP Location: Left Arm)   Pulse 80   Temp 98.5 F (36.9 C) (Oral)   Resp 16   Ht 5\' 6"  (1.676 m)  Wt 136 lb (61.7 kg)   SpO2 100%   BMI 21.95 kg/m   Visual Acuity Right Eye Distance:   Left Eye Distance:   Bilateral Distance:    Right Eye Near:   Left Eye Near:    Bilateral Near:     Physical Exam  Constitutional: She appears well-developed and well-nourished. No distress.  Musculoskeletal:       Left foot: There is tenderness and swelling.  Skin: She is not diaphoretic. There is erythema.  Right big toe warmth, blanchable erythema, and tenderness to palpation; no drainage  Nursing note and vitals reviewed.    UC Treatments / Results  Labs (all labs ordered are listed, but only abnormal results are displayed) Labs Reviewed - No data to display  EKG  EKG Interpretation None       Radiology No results found.  Procedures Procedures (including critical care time)  Medications Ordered in UC Medications  doxycycline (VIBRA-TABS) tablet 200 mg (200 mg Oral Given 10/22/16 2102)     Initial Impression / Assessment and Plan / UC Course  I have reviewed the triage vital signs and the nursing notes.  Pertinent labs & imaging results that were available during my care of the patient were reviewed by me and considered in my medical decision making (see chart for details).       Final Clinical Impressions(s) / UC Diagnoses   Final diagnoses:  Cellulitis of toe of left foot    New Prescriptions Discharge Medication List as of 10/22/2016  8:59 PM    START taking these medications   Details  doxycycline (VIBRA-TABS) 100 MG tablet Take 1 tablet (100 mg total) by mouth 2 (two) times daily., Starting Tue 10/22/2016, Normal       1. diagnosis reviewed with patient 2. rx as per orders above; reviewed possible side effects, interactions, risks and benefits  3. Recommend supportive treatment with  elevation, warm compresses 4. Follow-up prn if symptoms worsen or don't improve   Payton Mccallum, MD 10/25/16 1035

## 2016-10-24 ENCOUNTER — Ambulatory Visit (INDEPENDENT_AMBULATORY_CARE_PROVIDER_SITE_OTHER): Payer: Medicare Other | Admitting: Podiatry

## 2016-10-24 ENCOUNTER — Encounter: Payer: Self-pay | Admitting: Podiatry

## 2016-10-24 VITALS — BP 169/83 | HR 83

## 2016-10-24 DIAGNOSIS — B351 Tinea unguium: Secondary | ICD-10-CM

## 2016-10-24 DIAGNOSIS — L608 Other nail disorders: Secondary | ICD-10-CM

## 2016-10-24 DIAGNOSIS — M79676 Pain in unspecified toe(s): Secondary | ICD-10-CM

## 2016-10-24 DIAGNOSIS — E1142 Type 2 diabetes mellitus with diabetic polyneuropathy: Secondary | ICD-10-CM | POA: Diagnosis not present

## 2016-10-24 NOTE — Progress Notes (Signed)
   Subjective:    Patient ID: Wendy SatoPearlie B Hazzard, female    DOB: Jul 29, 1935, 81 y.o.   MRN: [id number]  HPI thi she presents the office today for an evaluation and treatment of her feet s patient presents the office with chief complaint of a painful left big toe, which has been painful for 3 days. Her daughter says that she was brought to the emergency care on Tuesday and the diagnosis of a cellulitis was made and she was prescribed doxycycline. The daughter says she did not pick up the medicine. Since she had scheduled a second opinion for her mother today. She says her mother is diabetic on insulin and gabapentin. She says her redness and swelling resolved since Tuesday, but the pain continues to be present.      Review of Systems  All other systems reviewed and are negative.      Objective:   Physical Exam GENERAL APPEARANCE: Alert, conversant. Appropriately groomed. No acute distress.  VASCULAR: Pedal pulses are  palpable at  [facility].  Capillary refill time is immediate to all digits,  Normal temperature gradient.   NEUROLOGIC: sensation is diminished  to 5.07 monofilament at 5/5 sites bilateral.  Light touch is intact bilateral, Muscle strength normal.  MUSCULOSKELETAL: acceptable muscle strength, tone and stability bilateral.  Intrinsic muscluature intact bilateral.  HAV  B/L. Hammer toe second left. NAILS  thick disfigured discolored nails both feet. No evidence of any bacterial infection or drainage. Pincer toenail is noted on the hallux nails bilaterally.  Mild swelling at the proximal nail fold of the left hallux. No evidence of any pus, redness or increased temperature.  Upon further examination, all her nails were noted to be thick disfigured and discolored. In addition to the hallux nails. DERMATOLOGIC: skin color, texture, and turgor are within normal limits.  No preulcerative lesions or ulcers  are seen, no interdigital maceration noted.  No open lesions present.  Digital  nails are asymptomatic. No drainage noted.         Assessment & Plan:  Onychomycosis  /Pincer nails  Hallux  B/L   IE  Debride nails.  RTC 3 months.  Told patient that she does not need to take the doxycycline that was previously ordered   Helane GuntherGregory Ladarrious Kirksey DPM

## 2016-11-26 IMAGING — US US RENAL
1 series · 14 of 25 positions shown · non-contrast
Comparison: None.

CLINICAL DATA: 79-year-old female with acute renal failure.

EXAM:
RENAL / URINARY TRACT ULTRASOUND COMPLETE

[Series 1: us renal · 0.19mm/px · 14 of 80 slices shown]
[im 1/80]
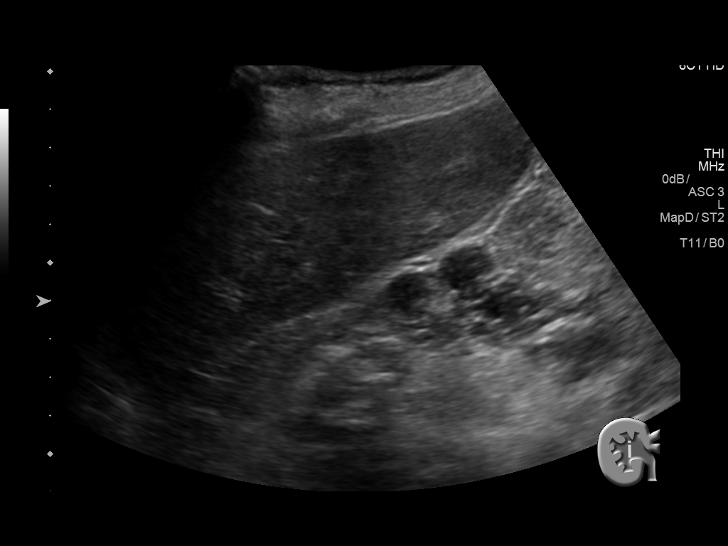
[im 7/80]
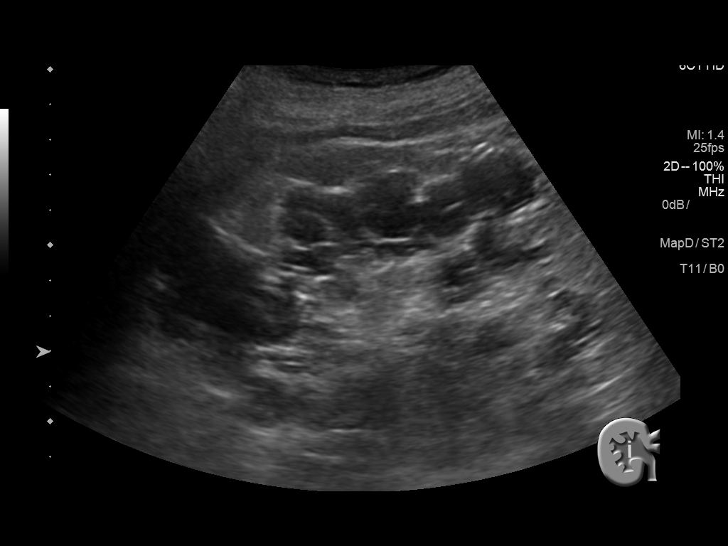
[im 14/80]
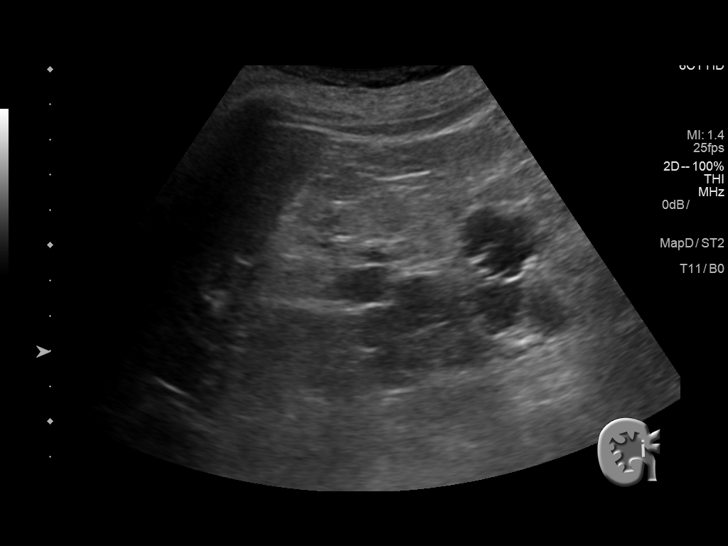
[im 20/80]
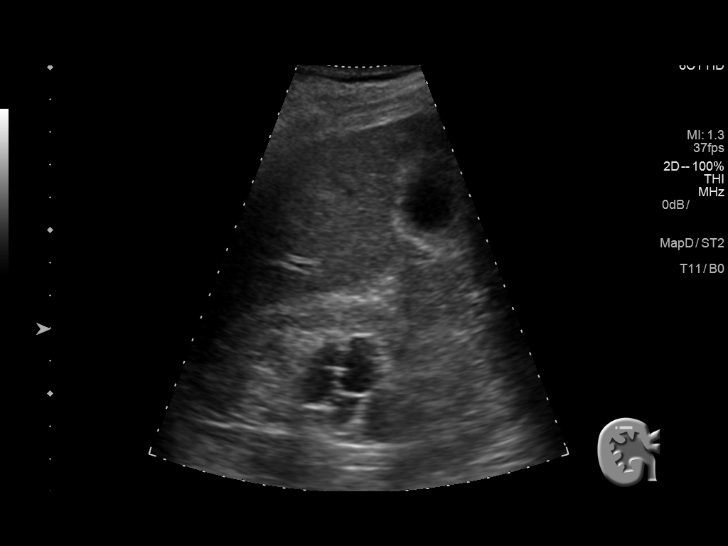
[im 27/80]
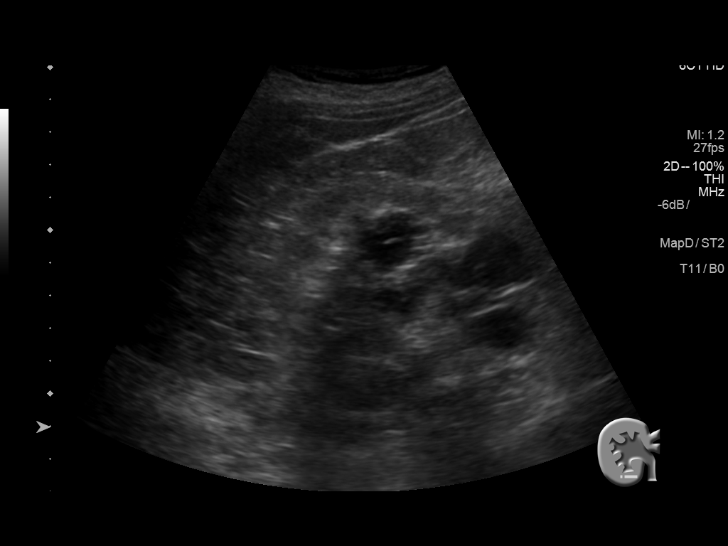
[im 30/80]
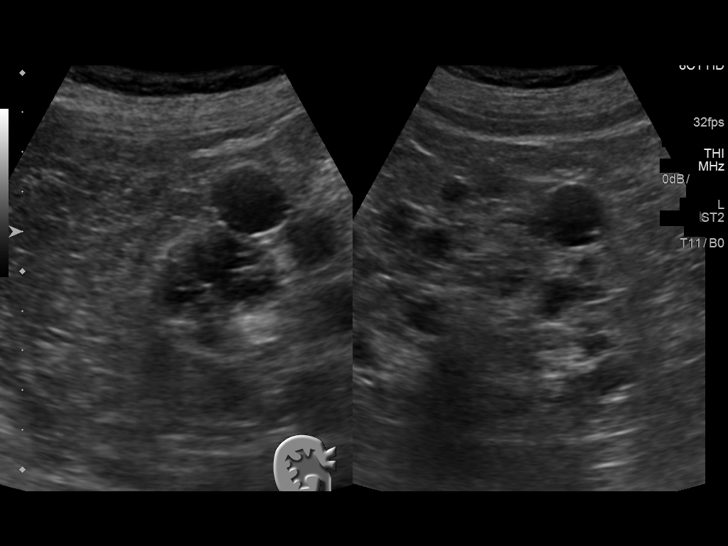
[im 37/80]
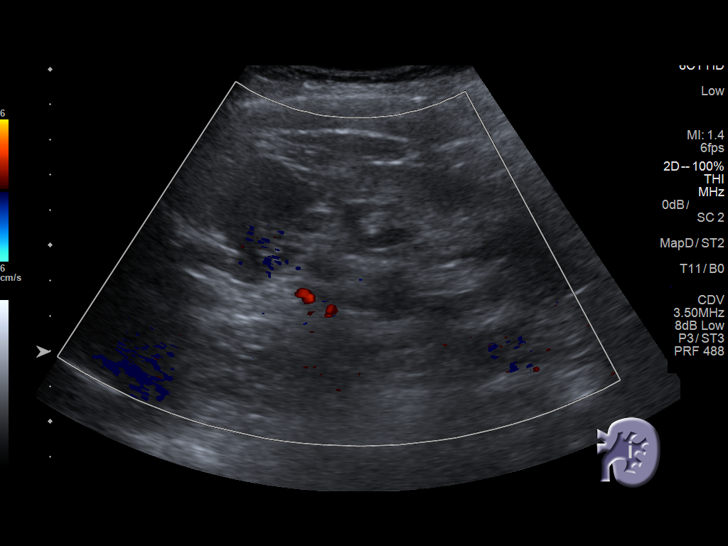
[im 43/80]
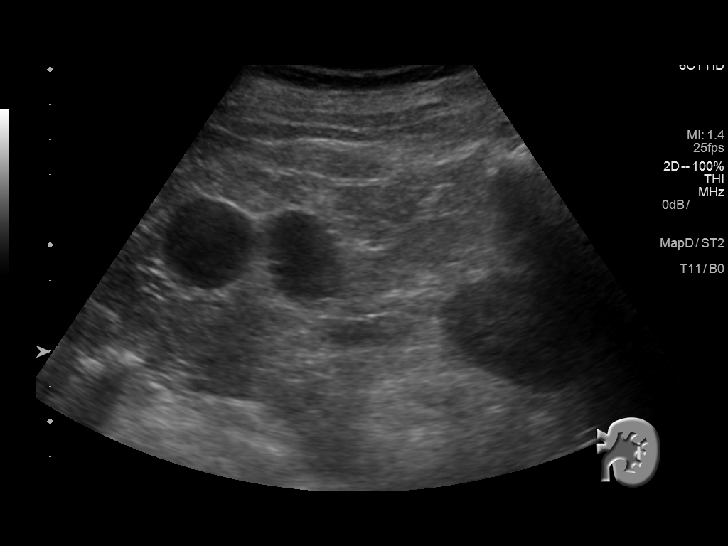
[im 50/80]
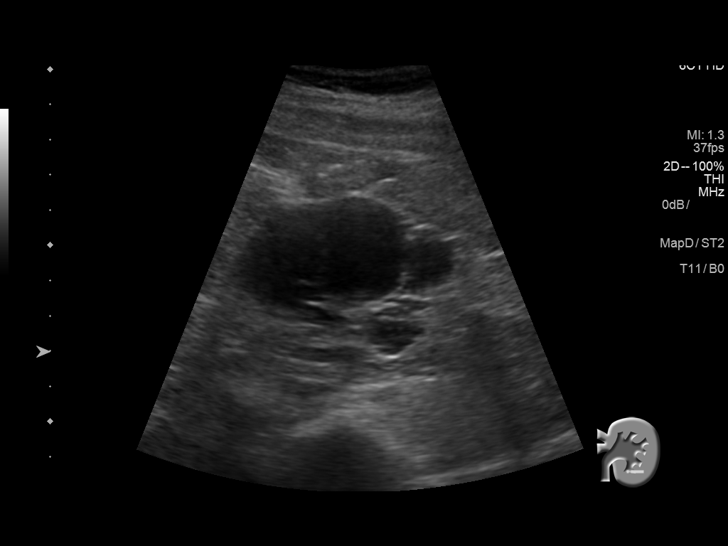
[im 53/80]
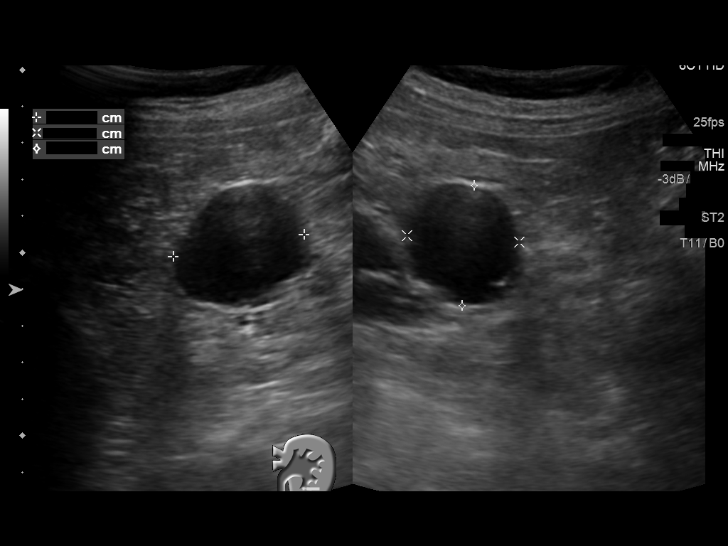
[im 60/80]
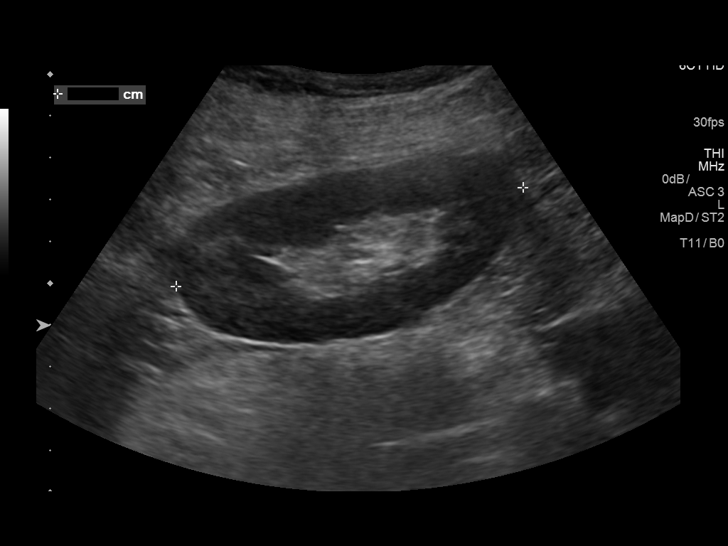
[im 66/80]
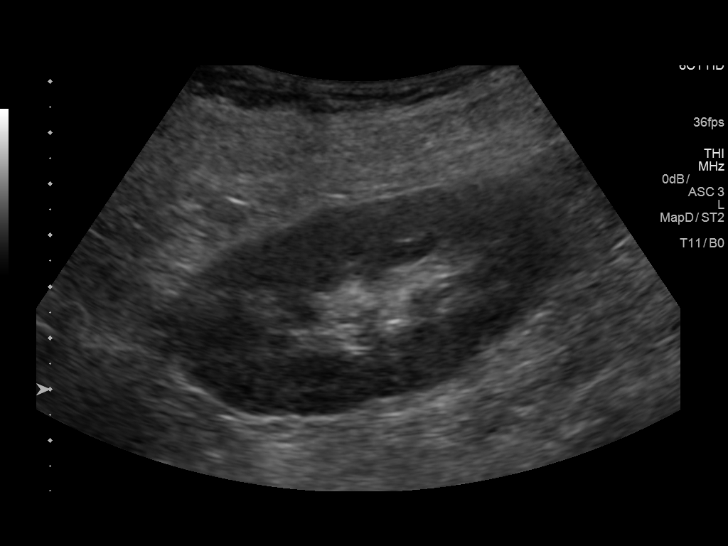
[im 73/80]
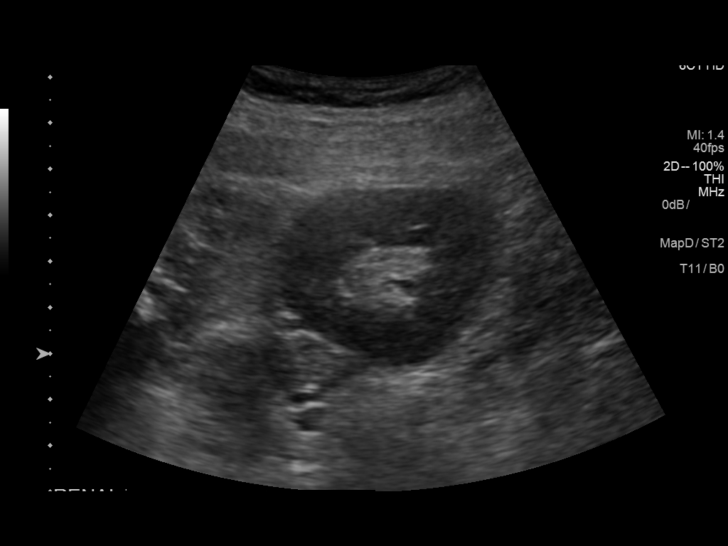
[im 80/80]
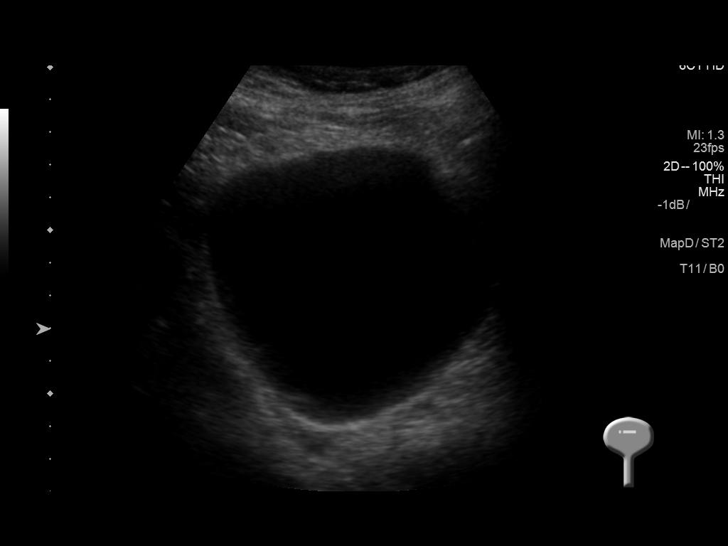

[14 of 25 positions shown; findings below may reference images not displayed]

FINDINGS: Right Native Kidney:

Length: 10.8 cm. Diffusely increased echogenicity, cortical atrophy
and multiple cysts identified. No definite hydronephrosis or solid
mass.

Left Native Kidney:

Length: 11.6 cm. Diffusely increased echogenicity, cortical atrophy
and multiple cysts identified. No definite hydronephrosis or solid
mass.

Transplant kidney:

Length:  8.6 cm.  No solid mass or hydronephrosis.

Bladder:

Appears normal for degree of bladder distention.
IMPRESSION: Slightly small transplant kidney without other significant
abnormality.

Chronic medical renal disease of both native kidneys with multiple
cysts. No evidence of hydronephrosis or definite solid mass.

## 2016-12-11 DEATH — deceased

## 2017-01-30 ENCOUNTER — Ambulatory Visit: Payer: Medicare Other | Admitting: Podiatry

## 2017-04-09 IMAGING — CR DG WRIST COMPLETE 3+V*R*
4 series · 4 of 4 positions shown · non-contrast
Comparison: None.

CLINICAL DATA: Pain and swelling of the radial side right hand.

EXAM:
RIGHT WRIST - COMPLETE 3+ VIEW

[wrist pa]
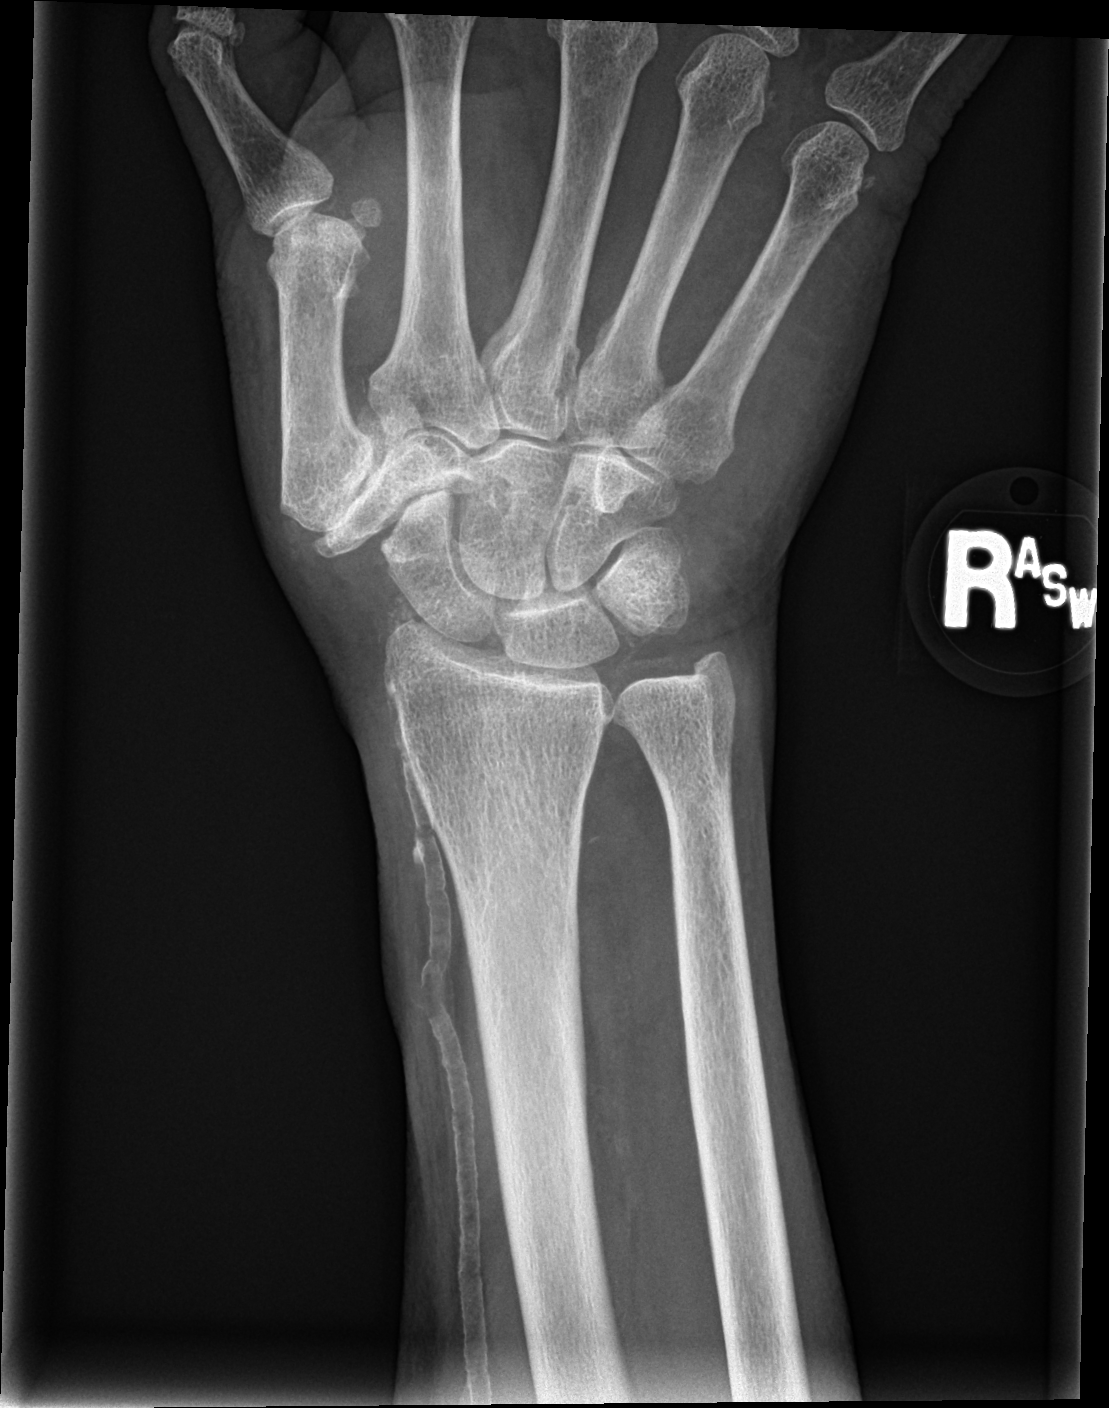

[wrist obl]
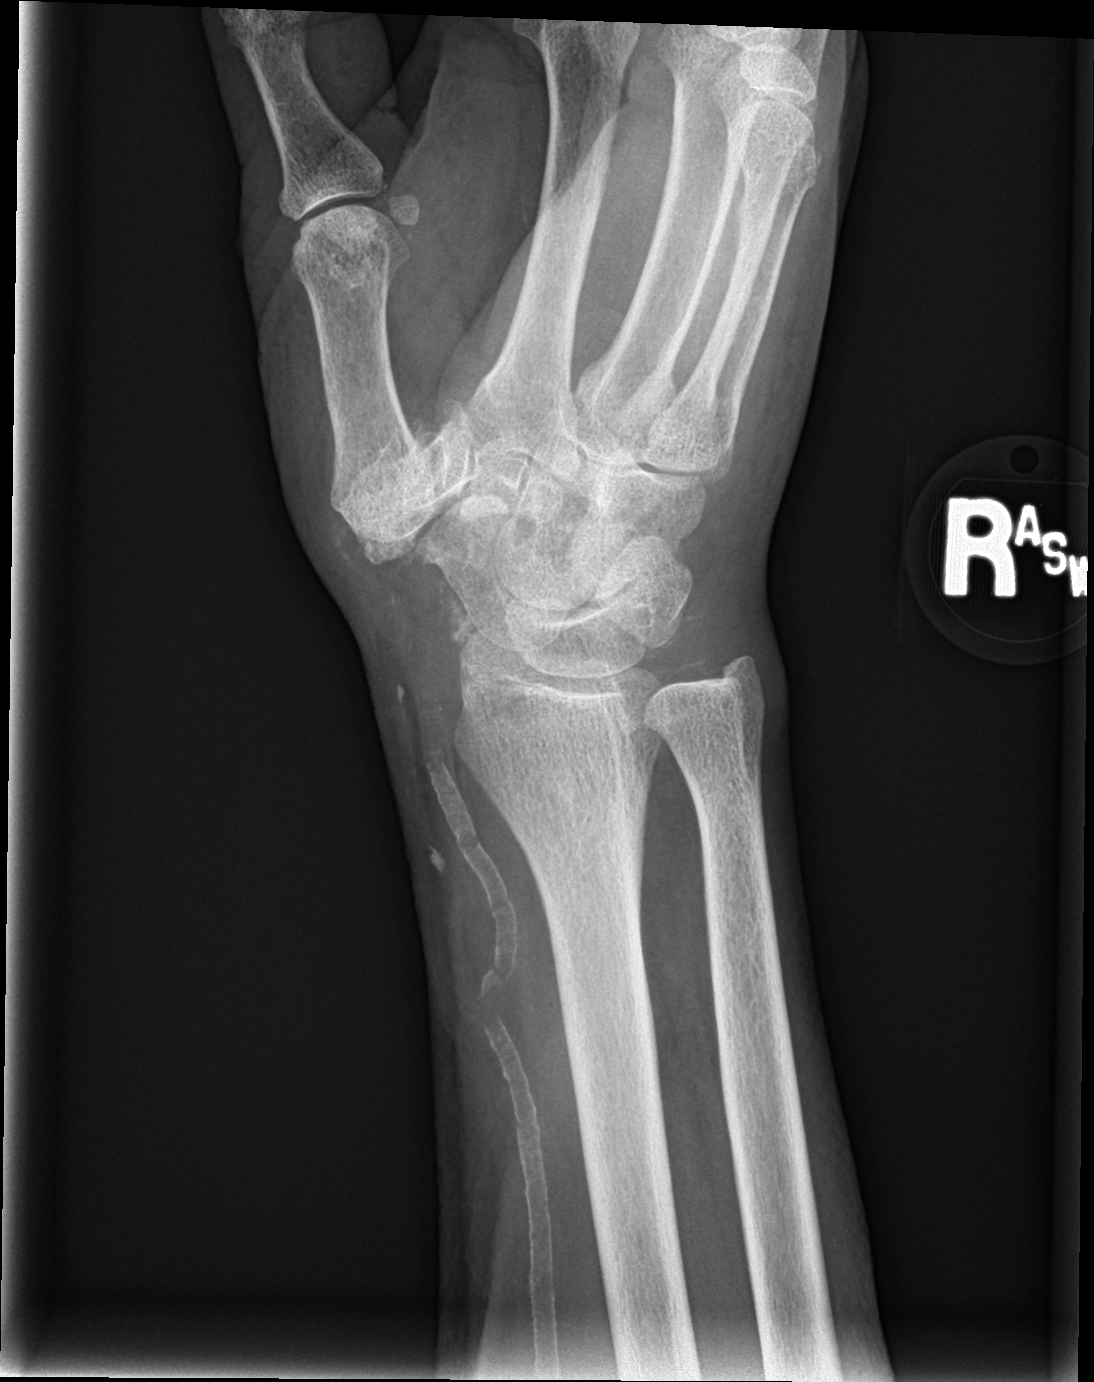

[wrist lat]
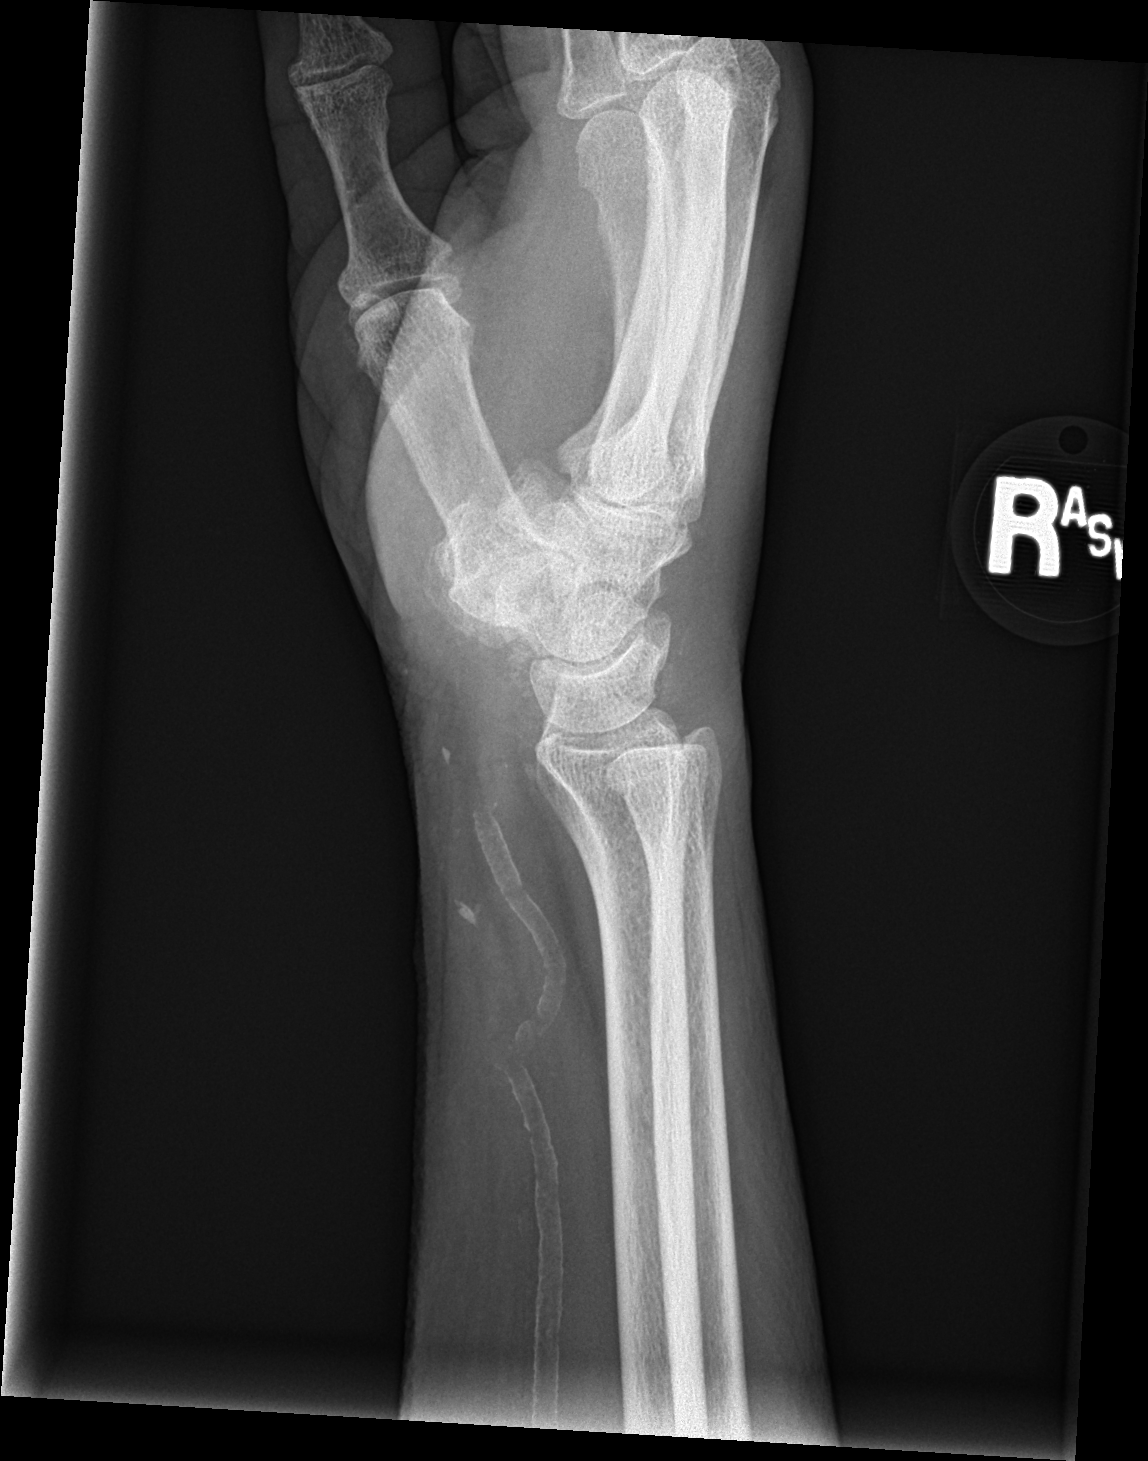

[wrist navicular]
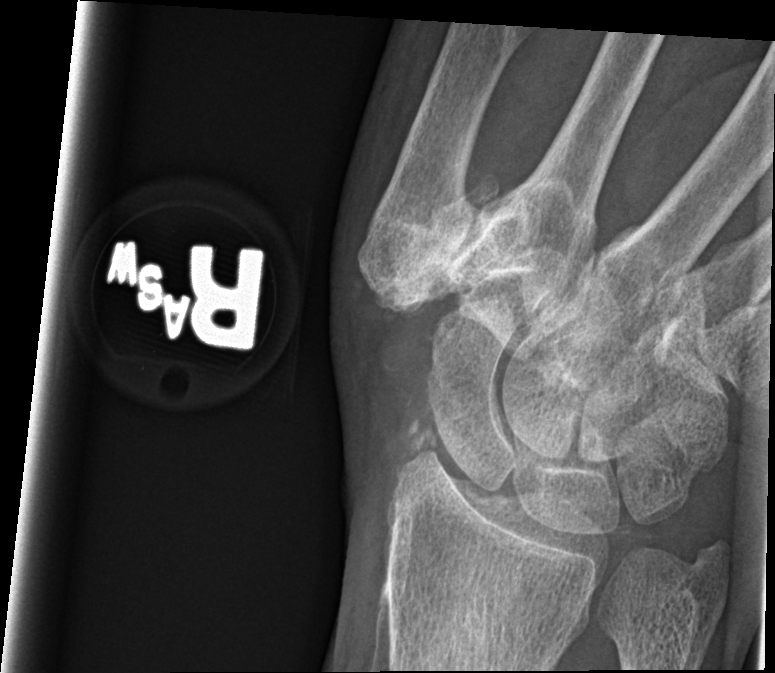

[4 of 4 positions shown; findings below may reference images not displayed]

FINDINGS: There is advanced osteoarthritis of the first carpometacarpal
articulation with chronic deformity of the multangular bones and
subluxation of the base of the first metacarpal. I do not see any
erosions to allow diagnosis of gout at this location. Small erosion
within the capitate is nonspecific. There is chondrocalcinosis of
the wrist joint. Regional arterial calcification is noted. No acute
fracture.
IMPRESSION: Chronic osteoarthritis of the first carpometacarpal joint with
deformity and subluxation. No finding specific for gout.
